# Patient Record
Sex: Female | Born: 1960 | Race: Black or African American | Marital: Married | Smoking: Never smoker
Health system: Southern US, Community
[De-identification: ages and names within clinical notes are randomized; demographics above are authoritative.]

## PROBLEM LIST (undated history)

## (undated) DIAGNOSIS — E785 Hyperlipidemia, unspecified: Secondary | ICD-10-CM

## (undated) DIAGNOSIS — I10 Essential (primary) hypertension: Secondary | ICD-10-CM

## (undated) DIAGNOSIS — K219 Gastro-esophageal reflux disease without esophagitis: Secondary | ICD-10-CM

## (undated) HISTORY — DX: Essential (primary) hypertension: I10

## (undated) HISTORY — DX: Gastro-esophageal reflux disease without esophagitis: K21.9

## (undated) HISTORY — DX: Hyperlipidemia, unspecified: E78.5

---

## 1997-08-21 ENCOUNTER — Ambulatory Visit (HOSPITAL_COMMUNITY): Admission: RE | Admit: 1997-08-21 | Discharge: 1997-08-21 | Payer: Self-pay | Admitting: Obstetrics and Gynecology

## 1997-10-15 ENCOUNTER — Inpatient Hospital Stay (HOSPITAL_COMMUNITY): Admission: AD | Admit: 1997-10-15 | Discharge: 1997-10-16 | Payer: Self-pay | Admitting: Obstetrics & Gynecology

## 1997-11-28 ENCOUNTER — Inpatient Hospital Stay (HOSPITAL_COMMUNITY): Admission: AD | Admit: 1997-11-28 | Discharge: 1997-11-28 | Payer: Self-pay | Admitting: Obstetrics and Gynecology

## 1997-11-29 ENCOUNTER — Inpatient Hospital Stay (HOSPITAL_COMMUNITY): Admission: AD | Admit: 1997-11-29 | Discharge: 1997-11-29 | Payer: Self-pay | Admitting: *Deleted

## 1997-12-17 ENCOUNTER — Inpatient Hospital Stay (HOSPITAL_COMMUNITY): Admission: AD | Admit: 1997-12-17 | Discharge: 1997-12-19 | Payer: Self-pay | Admitting: Obstetrics and Gynecology

## 1997-12-19 ENCOUNTER — Encounter (HOSPITAL_COMMUNITY): Admission: RE | Admit: 1997-12-19 | Discharge: 1998-03-19 | Payer: Self-pay | Admitting: Obstetrics and Gynecology

## 2001-03-17 ENCOUNTER — Other Ambulatory Visit: Admission: RE | Admit: 2001-03-17 | Discharge: 2001-03-17 | Payer: Self-pay | Admitting: Gynecology

## 2002-09-06 ENCOUNTER — Other Ambulatory Visit: Admission: RE | Admit: 2002-09-06 | Discharge: 2002-09-06 | Payer: Self-pay | Admitting: Gynecology

## 2004-08-24 ENCOUNTER — Ambulatory Visit: Payer: Self-pay | Admitting: Internal Medicine

## 2004-09-07 ENCOUNTER — Ambulatory Visit: Payer: Self-pay | Admitting: Internal Medicine

## 2004-09-11 ENCOUNTER — Ambulatory Visit: Payer: Self-pay | Admitting: Internal Medicine

## 2004-10-05 ENCOUNTER — Ambulatory Visit: Payer: Self-pay | Admitting: Internal Medicine

## 2005-03-23 ENCOUNTER — Other Ambulatory Visit: Admission: RE | Admit: 2005-03-23 | Discharge: 2005-03-23 | Payer: Self-pay | Admitting: Gynecology

## 2005-04-09 ENCOUNTER — Ambulatory Visit: Payer: Self-pay | Admitting: Internal Medicine

## 2005-04-15 LAB — HM COLONOSCOPY

## 2005-04-21 ENCOUNTER — Ambulatory Visit: Payer: Self-pay | Admitting: Internal Medicine

## 2005-05-04 ENCOUNTER — Ambulatory Visit: Payer: Self-pay | Admitting: Internal Medicine

## 2005-08-04 ENCOUNTER — Ambulatory Visit: Payer: Self-pay | Admitting: Internal Medicine

## 2005-10-05 ENCOUNTER — Ambulatory Visit: Payer: Self-pay | Admitting: Internal Medicine

## 2005-10-19 ENCOUNTER — Ambulatory Visit: Payer: Self-pay | Admitting: Internal Medicine

## 2005-10-29 ENCOUNTER — Ambulatory Visit: Payer: Self-pay | Admitting: Internal Medicine

## 2007-05-17 ENCOUNTER — Encounter: Payer: Self-pay | Admitting: Internal Medicine

## 2007-05-17 LAB — HM MAMMOGRAPHY: HM Mammogram: NORMAL

## 2007-05-22 ENCOUNTER — Encounter: Payer: Self-pay | Admitting: Internal Medicine

## 2007-05-22 ENCOUNTER — Encounter (INDEPENDENT_AMBULATORY_CARE_PROVIDER_SITE_OTHER): Payer: Self-pay | Admitting: *Deleted

## 2007-12-18 ENCOUNTER — Ambulatory Visit: Payer: Self-pay | Admitting: Family Medicine

## 2007-12-18 DIAGNOSIS — J069 Acute upper respiratory infection, unspecified: Secondary | ICD-10-CM | POA: Insufficient documentation

## 2007-12-18 DIAGNOSIS — I10 Essential (primary) hypertension: Secondary | ICD-10-CM | POA: Insufficient documentation

## 2008-01-01 ENCOUNTER — Ambulatory Visit: Payer: Self-pay | Admitting: Family Medicine

## 2008-01-01 DIAGNOSIS — R011 Cardiac murmur, unspecified: Secondary | ICD-10-CM

## 2008-01-02 ENCOUNTER — Encounter (INDEPENDENT_AMBULATORY_CARE_PROVIDER_SITE_OTHER): Payer: Self-pay | Admitting: *Deleted

## 2008-01-17 ENCOUNTER — Encounter: Payer: Self-pay | Admitting: Family Medicine

## 2008-01-17 ENCOUNTER — Ambulatory Visit: Payer: Self-pay

## 2008-01-21 LAB — CONVERTED CEMR LAB
Alkaline Phosphatase: 75 units/L (ref 39–117)
Bilirubin, Direct: 0.1 mg/dL (ref 0.0–0.3)
Calcium: 8.6 mg/dL (ref 8.4–10.5)
GFR calc Af Amer: 115 mL/min
Glucose, Bld: 70 mg/dL (ref 70–99)
HDL: 54.5 mg/dL (ref >39.0)
Potassium: 3.2 meq/L — ABNORMAL LOW (ref 3.5–5.1)
Sodium: 140 meq/L (ref 135–145)
Total Bilirubin: 0.9 mg/dL (ref 0.3–1.2)
Total CHOL/HDL Ratio: 4.1
Total Protein: 6.7 g/dL (ref 6.0–8.3)

## 2008-01-22 ENCOUNTER — Telehealth (INDEPENDENT_AMBULATORY_CARE_PROVIDER_SITE_OTHER): Payer: Self-pay | Admitting: *Deleted

## 2008-01-23 ENCOUNTER — Encounter (INDEPENDENT_AMBULATORY_CARE_PROVIDER_SITE_OTHER): Payer: Self-pay | Admitting: *Deleted

## 2008-06-24 ENCOUNTER — Encounter: Payer: Self-pay | Admitting: Internal Medicine

## 2008-09-16 ENCOUNTER — Ambulatory Visit: Payer: Self-pay | Admitting: Family Medicine

## 2008-09-16 DIAGNOSIS — M25519 Pain in unspecified shoulder: Secondary | ICD-10-CM

## 2008-12-23 ENCOUNTER — Ambulatory Visit: Payer: Self-pay | Admitting: Family Medicine

## 2008-12-23 DIAGNOSIS — R079 Chest pain, unspecified: Secondary | ICD-10-CM

## 2009-01-03 ENCOUNTER — Ambulatory Visit: Payer: Self-pay | Admitting: Family Medicine

## 2009-01-07 ENCOUNTER — Ambulatory Visit: Payer: Self-pay | Admitting: Family Medicine

## 2009-01-07 DIAGNOSIS — E785 Hyperlipidemia, unspecified: Secondary | ICD-10-CM

## 2009-01-07 LAB — CONVERTED CEMR LAB
Albumin: 3.6 g/dL (ref 3.5–5.2)
CO2: 29 meq/L (ref 19–32)
Calcium: 8.7 mg/dL (ref 8.4–10.5)
Chloride: 106 meq/L (ref 96–112)
Cholesterol: 216 mg/dL — ABNORMAL HIGH (ref 0–200)
Glucose, Bld: 101 mg/dL — ABNORMAL HIGH (ref 70–99)
HDL: 48.2 mg/dL (ref >39.00)
Potassium: 3.5 meq/L (ref 3.5–5.1)
Sodium: 141 meq/L (ref 135–145)
Total Bilirubin: 0.8 mg/dL (ref 0.3–1.2)
Total CHOL/HDL Ratio: 4
Triglycerides: 88 mg/dL (ref 0.0–149.0)
VLDL: 17.6 mg/dL (ref 0.0–40.0)

## 2009-12-06 ENCOUNTER — Encounter: Payer: Self-pay | Admitting: Internal Medicine

## 2010-09-01 ENCOUNTER — Encounter: Payer: Self-pay | Admitting: Family Medicine

## 2010-09-02 ENCOUNTER — Encounter: Payer: Self-pay | Admitting: Family Medicine

## 2010-09-02 ENCOUNTER — Ambulatory Visit (INDEPENDENT_AMBULATORY_CARE_PROVIDER_SITE_OTHER): Payer: BC Managed Care – PPO | Admitting: Family Medicine

## 2010-09-02 VITALS — BP 170/120 | HR 91 | Temp 98.9°F | Wt 190.6 lb

## 2010-09-02 DIAGNOSIS — I10 Essential (primary) hypertension: Secondary | ICD-10-CM

## 2010-09-02 LAB — BASIC METABOLIC PANEL
GFR: 98.86 mL/min (ref >60.00)
Glucose, Bld: 96 mg/dL (ref 70–99)
Potassium: 3.3 mEq/L — ABNORMAL LOW (ref 3.5–5.1)
Sodium: 141 mEq/L (ref 135–145)

## 2010-09-02 MED ORDER — AMLODIPINE BESYLATE 5 MG PO TABS: 5.0000 mg | ORAL_TABLET | Freq: Every day | ORAL | Status: DC

## 2010-09-02 MED ORDER — LOSARTAN POTASSIUM-HCTZ 100-25 MG PO TABS: 1.0000 | ORAL_TABLET | Freq: Every day | ORAL | Status: DC

## 2010-09-02 NOTE — Progress Notes (Signed)
  Subjective:    Patient here for follow-up of elevated blood pressure.  She is not exercising and is not adherent to a low-salt diet.  Blood pressure is not well controlled at home. Cardiac symptoms: none. Patient denies: chest pain, chest pressure/discomfort, dyspnea, exertional chest pressure/discomfort, fatigue, irregular heart beat, palpitations and tachypnea. Cardiovascular risk factors: hypertension, obesity (BMI >= 30 kg/m2) and sedentary lifestyle. Use of agents associated with hypertension: NSAIDS. History of target organ damage: none.  The following portions of the patient's history were reviewed and updated as appropriate: allergies, current medications, past family history, past medical history, past social history, past surgical history and problem list.  Review of Systems Pertinent items are noted in HPI.  + headaches   Objective:    BP 170/120  Pulse 91  Temp(Src) 98.9 F (37.2 C) (Oral)  Wt 190 lb 9.6 oz (86.456 kg)  SpO2 98% General appearance: alert, cooperative, appears stated age and no distress Eyes: conjunctivae/corneas clear. PERRL, EOM's intact. Fundi benign. Lungs: clear to auscultation bilaterally Heart: regular rate and rhythm, S1, S2 normal, no murmur, click, rub or gallop Extremities: extremities normal, atraumatic, no cyanosis or edema Neurologic: Alert and oriented X 3, normal strength and tone. Normal symmetric reflexes. Normal coordination and gait    Assessment:    Hypertension, stage 2. Evidence of target organ damage: none.    Plan:    Medication: begin norvasc and hyzaar. Screening labs for initial evaluation: basic metabolic panel. Dietary sodium restriction. Regular aerobic exercise. Follow up: 2 weeks and as needed.

## 2010-09-02 NOTE — Patient Instructions (Signed)

## 2010-09-07 ENCOUNTER — Other Ambulatory Visit (HOSPITAL_COMMUNITY): Payer: BC Managed Care – PPO | Admitting: Radiology

## 2010-09-15 ENCOUNTER — Other Ambulatory Visit: Payer: Self-pay | Admitting: *Deleted

## 2010-09-15 DIAGNOSIS — E875 Hyperkalemia: Secondary | ICD-10-CM

## 2010-09-16 ENCOUNTER — Other Ambulatory Visit (INDEPENDENT_AMBULATORY_CARE_PROVIDER_SITE_OTHER): Payer: BC Managed Care – PPO

## 2010-09-16 DIAGNOSIS — E875 Hyperkalemia: Secondary | ICD-10-CM

## 2010-09-16 LAB — BASIC METABOLIC PANEL
CO2: 29 mEq/L (ref 19–32)
Chloride: 104 mEq/L (ref 96–112)
Potassium: 3.2 mEq/L — ABNORMAL LOW (ref 3.5–5.1)
Sodium: 140 mEq/L (ref 135–145)

## 2010-09-16 NOTE — Progress Notes (Signed)
Labs only

## 2010-09-17 ENCOUNTER — Telehealth: Payer: Self-pay

## 2010-09-17 ENCOUNTER — Ambulatory Visit (HOSPITAL_COMMUNITY): Payer: BC Managed Care – PPO | Attending: Family Medicine | Admitting: Radiology

## 2010-09-17 DIAGNOSIS — I517 Cardiomegaly: Secondary | ICD-10-CM

## 2010-09-17 DIAGNOSIS — R51 Headache: Secondary | ICD-10-CM | POA: Insufficient documentation

## 2010-09-17 DIAGNOSIS — I10 Essential (primary) hypertension: Secondary | ICD-10-CM

## 2010-09-17 MED ORDER — POTASSIUM CHLORIDE ER 10 MEQ PO TBCR: 10.0000 meq | EXTENDED_RELEASE_TABLET | Freq: Every day | ORAL | Status: DC

## 2010-09-17 NOTE — Telephone Encounter (Signed)
Message copied by Arnette Norris on Thu Sep 17, 2010  4:15 PM ------      Message from: Lelon Perla      Created: Thu Sep 17, 2010 11:15 AM       If pt is already supplementing K with diet--- she should take KCL  1 po qd  #30   2 refills----  Recheck 2 weeks

## 2010-09-17 NOTE — Telephone Encounter (Signed)
Discussed with patient.... Rx faxed     KP

## 2010-09-23 NOTE — Progress Notes (Signed)
Labs only

## 2010-09-24 ENCOUNTER — Ambulatory Visit (INDEPENDENT_AMBULATORY_CARE_PROVIDER_SITE_OTHER): Payer: BC Managed Care – PPO | Admitting: Family Medicine

## 2010-09-24 VITALS — BP 138/100 | HR 93 | Temp 98.0°F | Wt 191.0 lb

## 2010-09-24 DIAGNOSIS — R319 Hematuria, unspecified: Secondary | ICD-10-CM

## 2010-09-24 DIAGNOSIS — I10 Essential (primary) hypertension: Secondary | ICD-10-CM

## 2010-09-24 DIAGNOSIS — E876 Hypokalemia: Secondary | ICD-10-CM

## 2010-09-24 LAB — POCT URINALYSIS DIPSTICK
Glucose, UA: NEGATIVE
Nitrite, UA: NEGATIVE
Protein, UA: NEGATIVE
Urobilinogen, UA: 0.2

## 2010-09-24 LAB — BASIC METABOLIC PANEL
BUN: 16 mg/dL (ref 6–23)
Chloride: 102 mEq/L (ref 96–112)
Glucose, Bld: 94 mg/dL (ref 70–99)
Potassium: 3.9 mEq/L (ref 3.5–5.1)
Sodium: 138 mEq/L (ref 135–145)

## 2010-09-24 LAB — CBC WITH DIFFERENTIAL/PLATELET
Basophils Absolute: 0 10*3/uL (ref 0.0–0.1)
Eosinophils Absolute: 0.1 10*3/uL (ref 0.0–0.7)
Lymphocytes Relative: 29.1 % (ref 12.0–46.0)
MCHC: 33.5 g/dL (ref 30.0–36.0)
Neutrophils Relative %: 64.3 % (ref 43.0–77.0)
Platelets: 271 10*3/uL (ref 150.0–400.0)
RDW: 12.8 % (ref 11.5–14.6)

## 2010-09-24 LAB — HEPATIC FUNCTION PANEL
ALT: 21 U/L (ref 0–35)
AST: 22 U/L (ref 0–37)
Albumin: 3.9 g/dL (ref 3.5–5.2)
Alkaline Phosphatase: 65 U/L (ref 39–117)

## 2010-09-24 LAB — LIPID PANEL
Cholesterol: 215 mg/dL — ABNORMAL HIGH (ref 0–200)
Total CHOL/HDL Ratio: 4

## 2010-09-24 MED ORDER — AMLODIPINE BESYLATE 10 MG PO TABS: 10.0000 mg | ORAL_TABLET | Freq: Every day | ORAL | Status: DC

## 2010-09-24 NOTE — Progress Notes (Signed)
  Subjective:    Patient here for follow-up of elevated blood pressure.  She is not exercising and is adherent to a low-salt diet.  Blood pressure is not well controlled at home. Cardiac symptoms: none. Patient denies: chest pain, chest pressure/discomfort, claudication, dyspnea, exertional chest pressure/discomfort, fatigue, irregular heart beat, lower extremity edema, near-syncope, orthopnea, palpitations, paroxysmal nocturnal dyspnea, syncope and tachypnea. Cardiovascular risk factors: hypertension, obesity (BMI >= 30 kg/m2) and sedentary lifestyle. Use of agents associated with hypertension: none. History of target organ damage: none.  The following portions of the patient's history were reviewed and updated as appropriate: allergies, current medications, past family history, past medical history, past social history, past surgical history and problem list.  Review of Systems Pertinent items are noted in HPI.     Objective:    BP 138/100  Pulse 93  Temp(Src) 98 F (36.7 C) (Oral)  Wt 191 lb (86.637 kg)  SpO2 93% General appearance: alert, cooperative and appears stated age Lungs: clear to auscultation bilaterally Heart: S1, S2 normal Extremities: extremities normal, atraumatic, no cyanosis or edema    Assessment:    Hypertension, stage 1 . Evidence of target organ damage: none.    Plan:    Medication: increase to norvasc 10mg   and con't hyzaar. Dietary sodium restriction. Regular aerobic exercise. Check blood pressures 2-3 times weekly and record. Follow up: 2 weeks and as needed.  Check fasting labs

## 2010-09-24 NOTE — Patient Instructions (Signed)
Hypertension Information  As your heart beats, it forces blood through your arteries. This force is your blood pressure. If the pressure is too high, it is called hypertension (HTN) or high blood pressure. HTN is dangerous because you may have it and not know it. High blood pressure may mean that your heart has to work harder to pump blood. Your arteries may be narrow or stiff. The extra work puts you at risk for heart disease, stroke, and other problems.    Blood pressure consists of two numbers, a higher number over a lower, 110/72, for example. It is stated as "110 over 72." The ideal is below 120 for the top number (systolic) and under 80 for the bottom (diastolic).    You should pay close attention to your blood pressure if you have certain conditions such as:   Heart failure.    Prior heart attack.      Diabetes    Chronic kidney disease.      Prior stroke.      Multiple risk factors for heart disease.      To see if you have HTN, your blood pressure should be measured while you are seated with your arm held at the level of the heart. It should be measured at least twice. A one-time elevated blood pressure reading (especially in the Emergency Department) does not mean that you need treatment. There may be conditions in which the blood pressure is different between your right and left arms. It is important to see your caregiver soon for a recheck.  Most people have essential hypertension which means that there is not a specific cause. This type of high blood pressure may be lowered by changing lifestyle factors such as:   Stress.    Smoking.     Lack of exercise.     Excessive weight.    Drug/tobacco/alcohol use.      Eating less salt.      Most people do not have symptoms from high blood pressure until it has caused damage to the body. Effective treatment can often prevent, delay or reduce that damage.  TREATMENT   Treatment for high blood pressure, when a cause has been identified, is directed at the cause. There are a large number of medications to treat HTN. These fall into several categories, and your caregiver will help you select the medicines that are best for you. Medications may have side effects. You should review side effects with your caregiver.  If your blood pressure stays high after you have made lifestyle changes or started on medicines,     Your medication(s) may need to be changed.    Other problems may need to be addressed.    Be certain you understand your prescriptions, and know how and when to take your medicine.    Be sure to follow up with your caregiver within the time frame advised (usually within two weeks) to have your blood pressure rechecked and to review your medications.    If you are taking more than one medicine to lower your blood pressure, make sure you know how and at what times they should be taken. Taking two medicines at the same time can result in blood pressure that is too low.   Document Released: 04/06/2005 Document Re-Released: 04/28/2009  [employer] Patient Information 2011 [employer].

## 2010-09-26 LAB — URINE CULTURE
Colony Count: NO GROWTH
Organism ID, Bacteria: NO GROWTH

## 2010-09-28 ENCOUNTER — Encounter: Payer: Self-pay | Admitting: *Deleted

## 2010-09-28 NOTE — Progress Notes (Signed)
Pt aware of results 

## 2010-10-15 ENCOUNTER — Ambulatory Visit (INDEPENDENT_AMBULATORY_CARE_PROVIDER_SITE_OTHER): Payer: BC Managed Care – PPO | Admitting: Family Medicine

## 2010-10-15 ENCOUNTER — Telehealth: Payer: Self-pay | Admitting: Family Medicine

## 2010-10-15 ENCOUNTER — Encounter: Payer: Self-pay | Admitting: Family Medicine

## 2010-10-15 VITALS — BP 124/80 | Temp 98.6°F | Wt 194.6 lb

## 2010-10-15 DIAGNOSIS — IMO0002 Reserved for concepts with insufficient information to code with codable children: Secondary | ICD-10-CM

## 2010-10-15 DIAGNOSIS — M5410 Radiculopathy, site unspecified: Secondary | ICD-10-CM

## 2010-10-15 MED ORDER — CYCLOBENZAPRINE HCL 10 MG PO TABS: 10.0000 mg | ORAL_TABLET | Freq: Three times a day (TID) | ORAL | Status: AC | PRN

## 2010-10-15 MED ORDER — NAPROXEN 500 MG PO TABS: 500.0000 mg | ORAL_TABLET | Freq: Two times a day (BID) | ORAL | Status: AC

## 2010-10-15 NOTE — Progress Notes (Signed)
  Subjective:    Patient ID: Diana Randall, female    DOB: 1960-12-25, 50 y.o.   MRN: [id number]  HPI Leg pain- R leg, sxs started a few nights ago but worse last night.  Leg was swollen up to level of knee per husband's report.  Walked 1.5 miles last night.  Leg feels 'heavy and thick'.  Some low back discomfort.  Sits all day at work.  Has hx of sciatica since childbirth.  No redness, current swelling, period of immobility or recent travel.   Review of Systems For ROS see HPI     Objective:   Physical Exam  Vitals reviewed. Constitutional: She appears well-developed and well-nourished. No distress.  Musculoskeletal: Normal range of motion. She exhibits tenderness (w/ palpation of paraspinal muscles, R>L, and palpation of R glute). She exhibits no edema.       Normal flexion and extension of back (-) SLR bilaterally (-) Homan's No swelling today  Neurological: She has normal reflexes. Coordination normal.          Assessment & Plan:

## 2010-10-15 NOTE — Patient Instructions (Signed)
This all appears to be related to your back spasm Take the Naprosyn regularly for the next 5-7 days and then as needed for pain Use the Flexeril (muscle relaxer) at night- will make you sleepy Try and have someone give you a massage to relax those tight muscles Use a heating pad on the low back and glutes to relax those muscles If your symptoms change or worsen- please call [facility]!!!

## 2010-10-15 NOTE — Telephone Encounter (Signed)
Discussed with patient and has been scheduled with Dr.Tabori at 1:45    KP

## 2010-10-15 NOTE — Assessment & Plan Note (Signed)
Pt w/out swelling or pain today.  Swelling last night was likely due to walking.  Heaviness is likely due to nerve irritation caused by paraspinal and glute spasm.  No evidence or suspicion of DVT.  Start scheduled NSAIDs and muscle relaxers.  Reviewed supportive care and red flags that should prompt return.  Pt expressed understanding and is in agreement w/ plan.

## 2010-10-15 NOTE — Telephone Encounter (Signed)
Pt needs to be seen ----she needs to be evaluated for a clot

## 2010-10-21 ENCOUNTER — Encounter: Payer: Self-pay | Admitting: Family Medicine

## 2010-10-21 ENCOUNTER — Ambulatory Visit (INDEPENDENT_AMBULATORY_CARE_PROVIDER_SITE_OTHER): Payer: BC Managed Care – PPO | Admitting: Family Medicine

## 2010-10-21 VITALS — BP 124/92 | HR 101 | Temp 99.1°F | Wt 195.0 lb

## 2010-10-21 DIAGNOSIS — I10 Essential (primary) hypertension: Secondary | ICD-10-CM

## 2010-10-21 MED ORDER — VALSARTAN-HYDROCHLOROTHIAZIDE 160-12.5 MG PO TABS: 1.0000 | ORAL_TABLET | Freq: Every day | ORAL | Status: DC

## 2010-10-21 NOTE — Progress Notes (Signed)
  Subjective:    Patient here for follow-up of elevated blood pressure.  She is not exercising and is adherent to a low-salt diet.  Blood pressure is not well controlled at home. Cardiac symptoms: none. Patient denies: chest pain, chest pressure/discomfort, claudication, dyspnea, exertional chest pressure/discomfort, fatigue, irregular heart beat, lower extremity edema, near-syncope, orthopnea, palpitations, paroxysmal nocturnal dyspnea, syncope and tachypnea. Cardiovascular risk factors: hypertension, obesity (BMI >= 30 kg/m2) and sedentary lifestyle. Use of agents associated with hypertension: none. History of target organ damage: none.  The following portions of the patient's history were reviewed and updated as appropriate: allergies, current medications, past family history, past medical history, past social history, past surgical history and problem list.  Review of Systems Pertinent items are noted in HPI.     Objective:    BP 124/92  Pulse 101  Temp(Src) 99.1 F (37.3 C) (Oral)  Wt 195 lb (88.451 kg)  SpO2 97% General appearance: alert, cooperative, appears stated age and no distress Lungs: clear to auscultation bilaterally Heart: S1, S2 normal Extremities: extremities normal, atraumatic, no cyanosis or edema    Assessment:    Hypertension, stage 1 . Evidence of target organ damage: none.    Plan:    Medication: discontinue hyzaar and begin diovan hct. Dietary sodium restriction. Regular aerobic exercise. Follow up: 2 weeks and as needed.

## 2010-10-21 NOTE — Patient Instructions (Signed)

## 2010-11-06 ENCOUNTER — Ambulatory Visit: Payer: BC Managed Care – PPO | Admitting: Family Medicine

## 2010-11-10 ENCOUNTER — Ambulatory Visit (INDEPENDENT_AMBULATORY_CARE_PROVIDER_SITE_OTHER): Payer: BC Managed Care – PPO | Admitting: Family Medicine

## 2010-11-10 ENCOUNTER — Encounter: Payer: Self-pay | Admitting: Family Medicine

## 2010-11-10 VITALS — BP 132/80 | HR 85 | Temp 98.9°F | Wt 191.4 lb

## 2010-11-10 DIAGNOSIS — I10 Essential (primary) hypertension: Secondary | ICD-10-CM

## 2010-11-10 DIAGNOSIS — E785 Hyperlipidemia, unspecified: Secondary | ICD-10-CM

## 2010-11-10 MED ORDER — VALSARTAN-HYDROCHLOROTHIAZIDE 160-12.5 MG PO TABS: 1.0000 | ORAL_TABLET | Freq: Every day | ORAL | Status: DC

## 2010-11-10 NOTE — Patient Instructions (Signed)

## 2010-11-10 NOTE — Progress Notes (Signed)
  Subjective:    Patient here for follow-up of elevated blood pressure.  She is not exercising and is adherent to a low-salt diet.  Blood pressure is well controlled at home. Cardiac symptoms: none. Patient denies: chest pain, chest pressure/discomfort, claudication, dyspnea, exertional chest pressure/discomfort, fatigue, irregular heart beat, lower extremity edema, near-syncope, orthopnea, palpitations, paroxysmal nocturnal dyspnea, syncope and tachypnea. Cardiovascular risk factors: dyslipidemia, hypertension, obesity (BMI >= 30 kg/m2) and sedentary lifestyle. Use of agents associated with hypertension: none. History of target organ damage: none.  The following portions of the patient's history were reviewed and updated as appropriate: allergies, current medications, past family history, past medical history, past social history, past surgical history and problem list.  Review of Systems Pertinent items are noted in HPI.     Objective:   Filed Vitals:   11/10/10 1403 11/10/10 1425  BP: 134/92 132/80  Pulse: 85   Temp: 98.9 F (37.2 C)   TempSrc: Oral   Weight: 191 lb 6.4 oz (86.818 kg)   SpO2: 98%     /gen-AAOx3  nAd Cor -+S1S2  -m Lungs--ctab/l  - rrw Ext--  -cce    Assessment:    Hypertension,  Evidence of target organ damage: none .    Plan:    con't meds Check labs  rto 3 months or sooner prn

## 2011-03-02 ENCOUNTER — Encounter: Payer: Self-pay | Admitting: Family Medicine

## 2011-05-06 ENCOUNTER — Other Ambulatory Visit: Payer: Self-pay | Admitting: *Deleted

## 2011-05-06 DIAGNOSIS — I10 Essential (primary) hypertension: Secondary | ICD-10-CM

## 2011-05-06 MED ORDER — AMLODIPINE BESYLATE 10 MG PO TABS: 10.0000 mg | ORAL_TABLET | Freq: Every day | ORAL | Status: DC

## 2011-05-06 NOTE — Telephone Encounter (Signed)
Pt called to advise that she is going out of town and needs a refill for amlodipine 10mg  sent to [facility] on [address]. Noted pt has been seen office since 11-10-10, notes advise for pt to be seen again in 3 months for a follow up, called pt to advise that I can send #30 however she needs to set up an upcoming apt for a follow up visit, offered for pt to set up with me today, pt declined and advised that she will call back in to schedule an follow up OV, reiterated that no more medications can be filled until she is seen in office, pt understood, sent amlodipine 10mg  #30 no refills to [facility] [address] per pt request

## 2012-11-06 ENCOUNTER — Ambulatory Visit (INDEPENDENT_AMBULATORY_CARE_PROVIDER_SITE_OTHER): Payer: BC Managed Care – PPO | Admitting: Family Medicine

## 2012-11-06 ENCOUNTER — Encounter: Payer: Self-pay | Admitting: Family Medicine

## 2012-11-06 VITALS — BP 165/105 | HR 88 | Temp 98.5°F | Wt 204.8 lb

## 2012-11-06 DIAGNOSIS — R609 Edema, unspecified: Secondary | ICD-10-CM

## 2012-11-06 DIAGNOSIS — R319 Hematuria, unspecified: Secondary | ICD-10-CM

## 2012-11-06 DIAGNOSIS — I10 Essential (primary) hypertension: Secondary | ICD-10-CM

## 2012-11-06 LAB — BASIC METABOLIC PANEL
BUN: 12 mg/dL (ref 6–23)
Calcium: 8.3 mg/dL — ABNORMAL LOW (ref 8.4–10.5)
GFR: 100.96 mL/min (ref >60.00)
Glucose, Bld: 73 mg/dL (ref 70–99)

## 2012-11-06 LAB — POCT URINALYSIS DIPSTICK
Glucose, UA: NEGATIVE
Nitrite, UA: NEGATIVE
Protein, UA: NEGATIVE
Spec Grav, UA: 1.015
Urobilinogen, UA: 0.2
pH, UA: 6.5

## 2012-11-06 LAB — HEPATIC FUNCTION PANEL
AST: 20 U/L (ref 0–37)
Total Bilirubin: 0.8 mg/dL (ref 0.3–1.2)

## 2012-11-06 LAB — CBC WITH DIFFERENTIAL/PLATELET
Basophils Absolute: 0 10*3/uL (ref 0.0–0.1)
Eosinophils Absolute: 0.1 10*3/uL (ref 0.0–0.7)
HCT: 39.8 % (ref 36.0–46.0)
Lymphocytes Relative: 35.7 % (ref 12.0–46.0)
Lymphs Abs: 2.9 10*3/uL (ref 0.7–4.0)
MCHC: 33.9 g/dL (ref 30.0–36.0)
Monocytes Relative: 3.7 % (ref 3.0–12.0)
Platelets: 252 10*3/uL (ref 150.0–400.0)
RDW: 13.5 % (ref 11.5–14.6)

## 2012-11-06 LAB — LIPID PANEL
Cholesterol: 221 mg/dL — ABNORMAL HIGH (ref 0–200)
HDL: 45.4 mg/dL (ref >39.00)
Total CHOL/HDL Ratio: 5
Triglycerides: 185 mg/dL — ABNORMAL HIGH (ref 0.0–149.0)
VLDL: 37 mg/dL (ref 0.0–40.0)

## 2012-11-06 MED ORDER — AMLODIPINE-VALSARTAN-HCTZ 10-160-12.5 MG PO TABS: ORAL_TABLET | ORAL | Status: DC

## 2012-11-06 NOTE — Progress Notes (Signed)
  Subjective:    Patient here for follow-up of elevated blood pressure.  She is not exercising and is adherent to a low-salt diet.  Blood pressure is not well controlled at home. Cardiac symptoms: none. Patient denies: chest pain, chest pressure/discomfort, claudication, dyspnea, exertional chest pressure/discomfort, fatigue, irregular heart beat, lower extremity edema, near-syncope, orthopnea, palpitations, paroxysmal nocturnal dyspnea, syncope and tachypnea. Cardiovascular risk factors: hypertension, obesity (BMI >= 30 kg/m2) and sedentary lifestyle. Use of agents associated with hypertension: none. History of target organ damage: none.  The following portions of the patient's history were reviewed and updated as appropriate: allergies, current medications, past family history, past medical history, past social history, past surgical history and problem list.  Review of Systems Pertinent items are noted in HPI.     Objective:    BP 165/105  Pulse 88  Temp(Src) 98.5 F (36.9 C) (Oral)  Wt 204 lb 12.8 oz (92.897 kg)  BMI 34.08 kg/m2  SpO2 97% General appearance: alert, cooperative, appears stated age and no distress Neck: no adenopathy, supple, symmetrical, trachea midline and thyroid not enlarged, symmetric, no tenderness/mass/nodules Lungs: clear to auscultation bilaterally Heart: S1, S2 normal Extremities: edema tr pitting edema b/l low ext    Assessment:    Hypertension, stage 1 . Evidence of target organ damage: none.    Plan:    Medication: resume exforge hct. Dietary sodium restriction. Regular aerobic exercise. Follow up: 2 weeks and as needed.

## 2012-11-06 NOTE — Patient Instructions (Signed)

## 2012-11-07 LAB — LDL CHOLESTEROL, DIRECT: Direct LDL: 151.9 mg/dL

## 2012-11-08 LAB — URINE CULTURE: Organism ID, Bacteria: NO GROWTH

## 2012-11-09 ENCOUNTER — Other Ambulatory Visit: Payer: Self-pay

## 2012-11-09 MED ORDER — POTASSIUM CHLORIDE ER 10 MEQ PO TBCR: 20.0000 meq | EXTENDED_RELEASE_TABLET | Freq: Every day | ORAL | Status: DC

## 2012-11-09 MED ORDER — ATORVASTATIN CALCIUM 20 MG PO TABS: 20.0000 mg | ORAL_TABLET | Freq: Every day | ORAL | Status: DC

## 2012-11-23 ENCOUNTER — Ambulatory Visit (INDEPENDENT_AMBULATORY_CARE_PROVIDER_SITE_OTHER): Payer: BC Managed Care – PPO | Admitting: Family Medicine

## 2012-11-23 ENCOUNTER — Encounter: Payer: Self-pay | Admitting: Family Medicine

## 2012-11-23 VITALS — BP 134/90 | HR 105 | Temp 98.7°F | Wt 201.0 lb

## 2012-11-23 DIAGNOSIS — I10 Essential (primary) hypertension: Secondary | ICD-10-CM

## 2012-11-23 DIAGNOSIS — E669 Obesity, unspecified: Secondary | ICD-10-CM | POA: Insufficient documentation

## 2012-11-23 MED ORDER — AMLODIPINE-VALSARTAN-HCTZ 10-320-25 MG PO TABS: ORAL_TABLET | ORAL | Status: DC

## 2012-11-23 NOTE — Progress Notes (Signed)
  Subjective:    Patient here for follow-up of elevated blood pressure.  She is exercising and is adherent to a low-salt diet.  Blood pressure is not well controlled at home. Cardiac symptoms: none. Patient denies: chest pain, chest pressure/discomfort, claudication, dyspnea, exertional chest pressure/discomfort, fatigue, irregular heart beat, lower extremity edema, near-syncope, orthopnea, palpitations, paroxysmal nocturnal dyspnea, syncope and tachypnea. Cardiovascular risk factors: dyslipidemia, family history of premature cardiovascular disease, hypertension and obesity (BMI >= 30 kg/m2). Use of agents associated with hypertension: none. History of target organ damage: none.  The following portions of the patient's history were reviewed and updated as appropriate: allergies, current medications, past family history, past medical history, past social history, past surgical history and problem list.  Review of Systems Pertinent items are noted in HPI.     Objective:    General appearance: alert, cooperative, appears stated age and no distress Neck: no adenopathy, supple, symmetrical, trachea midline and thyroid not enlarged, symmetric, no tenderness/mass/nodules Lungs: clear to auscultation bilaterally Heart: S1, S2 normal Extremities: extremities normal, atraumatic, no cyanosis or edema    Assessment:    Hypertension, stage 1 . Evidence of target organ damage: none.    Plan:    Medication: increase to exforge 10/320/25. Dietary sodium restriction. Regular aerobic exercise. Check blood pressures 2-3 weeks times weekly and record. Follow up: 3 weeks and as needed.

## 2012-11-23 NOTE — Patient Instructions (Signed)

## 2012-12-04 ENCOUNTER — Encounter: Payer: Self-pay | Admitting: Family Medicine

## 2012-12-14 ENCOUNTER — Encounter: Payer: Self-pay | Admitting: Family Medicine

## 2012-12-14 ENCOUNTER — Ambulatory Visit (INDEPENDENT_AMBULATORY_CARE_PROVIDER_SITE_OTHER): Payer: BC Managed Care – PPO | Admitting: Family Medicine

## 2012-12-14 VITALS — BP 132/84 | HR 94 | Temp 98.5°F | Wt 202.6 lb

## 2012-12-14 DIAGNOSIS — R079 Chest pain, unspecified: Secondary | ICD-10-CM

## 2012-12-14 DIAGNOSIS — I10 Essential (primary) hypertension: Secondary | ICD-10-CM

## 2012-12-14 DIAGNOSIS — E876 Hypokalemia: Secondary | ICD-10-CM

## 2012-12-14 MED ORDER — POTASSIUM CHLORIDE ER 10 MEQ PO TBCR: EXTENDED_RELEASE_TABLET | ORAL | Status: DC

## 2012-12-14 NOTE — Patient Instructions (Signed)

## 2012-12-14 NOTE — Progress Notes (Signed)
  Subjective:    Patient here for follow-up of elevated blood pressure.  She is not exercising and is adherent to a low-salt diet.  Blood pressure is well controlled at home. Cardiac symptoms: chest pressure/discomfort. Patient denies: claudication, dyspnea, exertional chest pressure/discomfort, fatigue, irregular heart beat, lower extremity edema, near-syncope, orthopnea, palpitations, paroxysmal nocturnal dyspnea, syncope and tachypnea. Cardiovascular risk factors: dyslipidemia, hypertension, obesity (BMI >= 30 kg/m2) and sedentary lifestyle. Use of agents associated with hypertension: none. History of target organ damage: none.  The following portions of the patient's history were reviewed and updated as appropriate: allergies, current medications, past family history, past medical history, past social history, past surgical history and problem list.  Review of Systems Pertinent items are noted in HPI.     Objective:    BP 132/84  Pulse 94  Temp(Src) 98.5 F (36.9 C) (Oral)  Wt 202 lb 9.6 oz (91.899 kg)  BMI 33.71 kg/m2  SpO2 97% General appearance: alert, cooperative, appears stated age and no distress Nose: Nares normal. Septum midline. Mucosa normal. No drainage or sinus tenderness. Throat: lips, mucosa, and tongue normal; teeth and gums normal Neck: no adenopathy, no carotid bruit, no JVD, supple, symmetrical, trachea midline and thyroid not enlarged, symmetric, no tenderness/mass/nodules Lungs: clear to auscultation bilaterally Heart: S1, S2 normal Extremities: extremities normal, atraumatic, no cyanosis or edema    Assessment:    Hypertension, normal blood pressure . Evidence of target organ damage: none.    Plan:    Medication: no change. Dietary sodium restriction. Regular aerobic exercise. Check blood pressures 2-3 times weekly and record. Follow up: 3 months and as needed.

## 2012-12-21 ENCOUNTER — Other Ambulatory Visit: Payer: Self-pay

## 2012-12-29 ENCOUNTER — Encounter: Payer: Self-pay | Admitting: Family Medicine

## 2012-12-29 DIAGNOSIS — I10 Essential (primary) hypertension: Secondary | ICD-10-CM

## 2012-12-29 MED ORDER — AMLODIPINE-VALSARTAN-HCTZ 10-320-25 MG PO TABS: ORAL_TABLET | ORAL | Status: DC

## 2012-12-29 NOTE — Telephone Encounter (Signed)
Please advise      KP 

## 2013-03-09 ENCOUNTER — Ambulatory Visit (INDEPENDENT_AMBULATORY_CARE_PROVIDER_SITE_OTHER): Payer: BC Managed Care – PPO | Admitting: Internal Medicine

## 2013-03-09 ENCOUNTER — Encounter: Payer: Self-pay | Admitting: Internal Medicine

## 2013-03-09 ENCOUNTER — Ambulatory Visit: Payer: BC Managed Care – PPO | Admitting: Physician Assistant

## 2013-03-09 VITALS — BP 122/90 | HR 95 | Temp 99.1°F | Ht 66.0 in | Wt 199.2 lb

## 2013-03-09 DIAGNOSIS — J209 Acute bronchitis, unspecified: Secondary | ICD-10-CM

## 2013-03-09 DIAGNOSIS — I10 Essential (primary) hypertension: Secondary | ICD-10-CM

## 2013-03-09 DIAGNOSIS — R079 Chest pain, unspecified: Secondary | ICD-10-CM

## 2013-03-09 MED ORDER — HYDROCODONE-HOMATROPINE 5-1.5 MG/5ML PO SYRP: 5.0000 mL | ORAL_SOLUTION | Freq: Four times a day (QID) | ORAL | Status: DC | PRN

## 2013-03-09 MED ORDER — LEVOFLOXACIN 250 MG PO TABS: 250.0000 mg | ORAL_TABLET | Freq: Every day | ORAL | Status: DC

## 2013-03-09 NOTE — Assessment & Plan Note (Signed)
Exam benign, related to coughing, ok to follow for now

## 2013-03-09 NOTE — Assessment & Plan Note (Signed)
stable overall by history and exam, recent data reviewed with pt, and pt to continue medical treatment as before,  to f/u any worsening symptoms or concerns BP Readings from Last 3 Encounters:  03/09/13 122/90  12/14/12 132/84  11/23/12 134/90

## 2013-03-09 NOTE — Assessment & Plan Note (Signed)
Mild to mod, for antibx course,  to f/u any worsening symptoms or concerns, and cough med prn 

## 2013-03-09 NOTE — Progress Notes (Signed)
   Subjective:    Patient ID: Diana Randall, female    DOB: 08/16/1960, 53 y.o.   MRN: [id number]  HPI  Here with acute onset mild to mod 2-3 days ST, HA, general weakness and malaise, with prod cough greenish sputum, but Pt denies chest pain, increased sob or doe, wheezing, orthopnea, PND, increased LE swelling, palpitations, dizziness or syncope, all except for some bilat ant chest soreness with cough.  Pt denies new neurological symptoms such as new headache, or facial or extremity weakness or numbness  Pt denies polydipsia, polyuria Past Medical History  Diagnosis Date  . Hypertension   . Hyperlipidemia    No past surgical history on file.  reports that she has never smoked. She has never used smokeless tobacco. She reports that she drinks alcohol. She reports that she does not use illicit drugs. family history is not on file. No Known Allergies Current Outpatient Prescriptions on File Prior to Visit  Medication Sig Dispense Refill  . Amlodipine-Valsartan-HCTZ (EXFORGE HCT) 10-320-25 MG TABS 1 po qd  30 tablet  2  . atorvastatin (LIPITOR) 20 MG tablet Take 1 tablet (20 mg total) by mouth daily.  30 tablet  2  . potassium chloride (K-DUR) 10 MEQ tablet 1 po qd  30 tablet  5   No current facility-administered medications on file prior to visit.   Review of Systems  Constitutional: Negative for unexpected weight change, or unusual diaphoresis  HENT: Negative for tinnitus.   Eyes: Negative for photophobia and visual disturbance.  Respiratory: Negative for choking and stridor.   Gastrointestinal: Negative for vomiting and blood in stool.  Genitourinary: Negative for hematuria and decreased urine volume.  Musculoskeletal: Negative for acute joint swelling Skin: Negative for color change and wound.  Neurological: Negative for tremors and numbness other than noted  Psychiatric/Behavioral: Negative for decreased concentration or  hyperactivity.       Objective:   Physical Exam BP  122/90  Pulse 95  Temp(Src) 99.1 F (37.3 C) (Oral)  Ht 5\' 6"  (1.676 m)  Wt 199 lb 4 oz (90.379 kg)  BMI 32.18 kg/m2  SpO2 97% VS noted, mild ill  Constitutional: Pt appears well-developed and well-nourished.  HENT: Head: NCAT.  Right Ear: External ear normal.  Left Ear: External ear normal.  Bilat tm's with mild erythema.  Max sinus areas non tender.  Pharynx with mild erythema, no exudate Eyes: Conjunctivae and EOM are normal. Pupils are equal, round, and reactive to light.  Neck: Normal range of motion. Neck supple.  Cardiovascular: Normal rate and regular rhythm.   Pulmonary/Chest: Effort normal and breath sounds without rales or wheezing.  Neurological: Pt is alert. Not confused  Skin: Skin is warm. No erythema.  Psychiatric: Pt behavior is normal. Thought content normal.     Assessment & Plan:

## 2013-03-09 NOTE — Patient Instructions (Addendum)
Please take all new medication as prescribed - the antibiotic, and cough medicine  Please continue all other medications as before, and refills have been done if requested. Please have the pharmacy call with any other refills you may need.  Please remember to sign up for MyChart if you have not done so, as this will be important to you in the future with finding out test results, communicating by private email, and scheduling acute appointments online when needed.   

## 2013-03-09 NOTE — Progress Notes (Signed)
Pre-visit discussion using our clinic review tool. No additional management support is needed unless otherwise documented below in the visit note.  

## 2013-04-02 ENCOUNTER — Encounter: Payer: Self-pay | Admitting: Family Medicine

## 2013-04-05 ENCOUNTER — Telehealth: Payer: Self-pay | Admitting: *Deleted

## 2013-04-05 ENCOUNTER — Encounter: Payer: Self-pay | Admitting: Nurse Practitioner

## 2013-04-05 ENCOUNTER — Ambulatory Visit (INDEPENDENT_AMBULATORY_CARE_PROVIDER_SITE_OTHER): Payer: BC Managed Care – PPO | Admitting: Nurse Practitioner

## 2013-04-05 VITALS — BP 121/79 | HR 81 | Temp 97.9°F | Wt 201.6 lb

## 2013-04-05 DIAGNOSIS — J069 Acute upper respiratory infection, unspecified: Secondary | ICD-10-CM

## 2013-04-05 DIAGNOSIS — R059 Cough, unspecified: Secondary | ICD-10-CM

## 2013-04-05 DIAGNOSIS — R05 Cough: Secondary | ICD-10-CM

## 2013-04-05 MED ORDER — BENZONATATE 100 MG PO CAPS: ORAL_CAPSULE | ORAL | Status: DC

## 2013-04-05 NOTE — Telephone Encounter (Signed)
Rx unavailable at [facility]. [facility] [address]

## 2013-04-05 NOTE — Progress Notes (Signed)
   Subjective:    Patient ID: Diana Randall, female    DOB: 05/02/1960, 53 y.o.   MRN: [id number]  HPI Comments: Gives Hx of URI 1 mo. Ago-treated w/levaquin. Got better. Then developed "stomach virus" w/N&V (states rest of family had it). Got better. The started coughing 2da.  URI  This is a new problem. The current episode started in the past 7 days (2d). The problem has been unchanged. There has been no fever. Associated symptoms include congestion, coughing, headaches, a plugged ear sensation, sinus pain, a sore throat (scratchy) and wheezing (hears "gurgle" when lays down.). Pertinent negatives include no abdominal pain, chest pain, diarrhea, ear pain, nausea, sneezing, swollen glands or vomiting. She has tried nothing for the symptoms.      Review of Systems  Constitutional: Negative for fever, chills, activity change, appetite change and fatigue.  HENT: Positive for congestion, sinus pressure and sore throat (scratchy). Negative for ear pain, sneezing and voice change.   Eyes: Negative for redness.  Respiratory: Positive for cough and wheezing (hears "gurgle" when lays down.). Negative for chest tightness and shortness of breath.   Cardiovascular: Negative for chest pain.  Gastrointestinal: Negative for nausea, vomiting, abdominal pain and diarrhea.  Musculoskeletal: Negative for back pain.  Neurological: Positive for headaches.       Objective:   Physical Exam  Vitals reviewed. Constitutional: She is oriented to person, place, and time. She appears well-developed and well-nourished. No distress.  HENT:  Head: Normocephalic and atraumatic.  Right Ear: External ear normal.  Left Ear: External ear normal.  Mouth/Throat: Oropharynx is clear and moist. No oropharyngeal exudate.  Nasal quality to voice  Eyes: Conjunctivae are normal. Right eye exhibits no discharge. Left eye exhibits no discharge.  Neck: Normal range of motion. Neck supple. No thyromegaly present.    Cardiovascular: Normal rate, regular rhythm and normal heart sounds.   No murmur heard. Pulmonary/Chest: Effort normal and breath sounds normal. No respiratory distress. She has no wheezes. She has no rales.  Lymphadenopathy:    She has no cervical adenopathy.  Neurological: She is alert and oriented to person, place, and time.  Skin: Skin is warm and dry.  Psychiatric: She has a normal mood and affect. Her behavior is normal. Thought content normal.          Assessment & Plan:  1. Viral upper respiratory illness 2d. Nasal congestion, cough, afebrile. See pt instructions.  2. Cough Likely r/t post-nasal drip. Clear BS. - benzonatate (TESSALON) 100 MG capsule; Take 1-2 capsules po up to 3 times daily PRN cough  Dispense: 60 capsule; Refill: 0

## 2013-04-05 NOTE — Patient Instructions (Signed)
You have a cold virus causing your symptoms. The average duration of cold symptoms is 14 days. Start daily sinus rinses (neilmed Sinus Rinse). Use 30 mg pseudoephedrine twice daily. Caution: monitor blood pressure 1 hour after taking med, stop if pressure over 140/90. Sip fluids every hour. Rest. If you are not feeling better in 1 week or develop fever or chest pain, call us for re-evaluation. Feel better!  Upper Respiratory Infection, Adult An upper respiratory infection (URI) is also sometimes known as the common cold. The upper respiratory tract includes the nose, sinuses, throat, trachea, and bronchi. Bronchi are the airways leading to the lungs. Most people improve within 1 week, but symptoms can last up to 2 weeks. A residual cough may last even longer.  CAUSES Many different viruses can infect the tissues lining the upper respiratory tract. The tissues become irritated and inflamed and often become very moist. Mucus production is also common. A cold is contagious. You can easily spread the virus to others by oral contact. This includes kissing, sharing a glass, coughing, or sneezing. Touching your mouth or nose and then touching a surface, which is then touched by another person, can also spread the virus. SYMPTOMS  Symptoms typically develop 1 to 3 days after you come in contact with a cold virus. Symptoms vary from person to person. They may include:  Runny nose.  Sneezing.  Nasal congestion.  Sinus irritation.  Sore throat.  Loss of voice (laryngitis).  Cough.  Fatigue.  Muscle aches.  Loss of appetite.  Headache.  Low-grade fever. DIAGNOSIS  You might diagnose your own cold based on familiar symptoms, since most people get a cold 2 to 3 times a year. Your caregiver can confirm this based on your exam. Most importantly, your caregiver can check that your symptoms are not due to another disease such as strep throat, sinusitis, pneumonia, asthma, or epiglottitis. Blood tests,  throat tests, and X-rays are not necessary to diagnose a common cold, but they may sometimes be helpful in excluding other more serious diseases. Your caregiver will decide if any further tests are required. RISKS AND COMPLICATIONS  You may be at risk for a more severe case of the common cold if you smoke cigarettes, have chronic heart disease (such as heart failure) or lung disease (such as asthma), or if you have a weakened immune system. The very young and very old are also at risk for more serious infections. Bacterial sinusitis, middle ear infections, and bacterial pneumonia can complicate the common cold. The common cold can worsen asthma and chronic obstructive pulmonary disease (COPD). Sometimes, these complications can require emergency medical care and may be life-threatening. PREVENTION  The best way to protect against getting a cold is to practice good hygiene. Avoid oral or hand contact with people with cold symptoms. Wash your hands often if contact occurs. There is no clear evidence that vitamin C, vitamin E, echinacea, or exercise reduces the chance of developing a cold. However, it is always recommended to get plenty of rest and practice good nutrition. TREATMENT  Treatment is directed at relieving symptoms. There is no cure. Antibiotics are not effective, because the infection is caused by a virus, not by bacteria. Treatment may include:  Increased fluid intake. Sports drinks offer valuable electrolytes, sugars, and fluids.  Breathing heated mist or steam (vaporizer or shower).  Eating chicken soup or other clear broths, and maintaining good nutrition.  Getting plenty of rest.  Using gargles or lozenges for comfort.  Controlling  fevers with ibuprofen or acetaminophen as directed by your caregiver.  Increasing usage of your inhaler if you have asthma. Zinc gel and zinc lozenges, taken in the first 24 hours of the common cold, can shorten the duration and lessen the severity of  symptoms. Pain medicines may help with fever, muscle aches, and throat pain. A variety of non-prescription medicines are available to treat congestion and runny nose. Your caregiver can make recommendations and may suggest nasal or lung inhalers for other symptoms.  HOME CARE INSTRUCTIONS   Only take over-the-counter or prescription medicines for pain, discomfort, or fever as directed by your caregiver.  Use a warm mist humidifier or inhale steam from a shower to increase air moisture. This may keep secretions moist and make it easier to breathe.  Drink enough water and fluids to keep your urine clear or pale yellow.  Rest as needed.  Return to work when your temperature has returned to normal or as your caregiver advises. You may need to stay home longer to avoid infecting others. You can also use a face mask and careful hand washing to prevent spread of the virus. SEEK MEDICAL CARE IF:   After the first few days, you feel you are getting worse rather than better.  You need your caregiver's advice about medicines to control symptoms.  You develop chills, worsening shortness of breath, or brown or red sputum. These may be signs of pneumonia.  You develop yellow or brown nasal discharge or pain in the face, especially when you bend forward. These may be signs of sinusitis.  You develop a fever, swollen neck glands, pain with swallowing, or white areas in the back of your throat. These may be signs of strep throat. SEEK IMMEDIATE MEDICAL CARE IF:   You have a fever.  You develop severe or persistent headache, ear pain, sinus pain, or chest pain.  You develop wheezing, a prolonged cough, cough up blood, or have a change in your usual mucus (if you have chronic lung disease).  You develop sore muscles or a stiff neck. Document Released: 07/28/2000 Document Revised: 04/26/2011 Document Reviewed: 06/05/2010 [facility] Patient Information 2014 [place], [place].

## 2013-04-05 NOTE — Progress Notes (Signed)
Pre-visit discussion using our clinic review tool. No additional management support is needed unless otherwise documented below in the visit note.  

## 2013-05-23 ENCOUNTER — Other Ambulatory Visit: Payer: Self-pay | Admitting: Family Medicine

## 2013-07-10 LAB — HM MAMMOGRAPHY: HM Mammogram: NEGATIVE

## 2013-08-30 ENCOUNTER — Other Ambulatory Visit: Payer: Self-pay | Admitting: Family Medicine

## 2014-01-29 ENCOUNTER — Telehealth: Payer: Self-pay | Admitting: Family Medicine

## 2014-01-29 DIAGNOSIS — I10 Essential (primary) hypertension: Secondary | ICD-10-CM

## 2014-01-29 MED ORDER — AMLODIPINE-VALSARTAN-HCTZ 10-320-25 MG PO TABS
ORAL_TABLET | ORAL | Status: DC
Start: 2014-01-29 — End: 2014-04-23

## 2014-01-29 NOTE — Telephone Encounter (Signed)
Pt requesting a refill Amlodipine-Valsartan-HCTZ (EXFORGE HCT) 10-320-25 MG TABS

## 2014-02-04 ENCOUNTER — Ambulatory Visit: Payer: BC Managed Care – PPO | Admitting: Family Medicine

## 2014-04-18 ENCOUNTER — Telehealth: Payer: Self-pay

## 2014-04-18 NOTE — Telephone Encounter (Signed)
Left a message for call back.  Mammo- 07/10/13-negative [facility]- 04/15/05 Flu- ? Pap- ? Tdap- ?

## 2014-04-22 ENCOUNTER — Encounter: Payer: Self-pay | Admitting: Family Medicine

## 2014-04-22 ENCOUNTER — Ambulatory Visit (INDEPENDENT_AMBULATORY_CARE_PROVIDER_SITE_OTHER): Payer: BLUE CROSS/BLUE SHIELD | Admitting: Family Medicine

## 2014-04-22 VITALS — BP 132/96 | HR 73 | Temp 98.1°F | Ht 65.5 in | Wt 198.2 lb

## 2014-04-22 DIAGNOSIS — I10 Essential (primary) hypertension: Secondary | ICD-10-CM

## 2014-04-22 DIAGNOSIS — E785 Hyperlipidemia, unspecified: Secondary | ICD-10-CM

## 2014-04-22 DIAGNOSIS — R1013 Epigastric pain: Secondary | ICD-10-CM

## 2014-04-22 DIAGNOSIS — Z0001 Encounter for general adult medical examination with abnormal findings: Secondary | ICD-10-CM

## 2014-04-22 DIAGNOSIS — Z0000 Encounter for general adult medical examination without abnormal findings: Secondary | ICD-10-CM

## 2014-04-22 DIAGNOSIS — R0789 Other chest pain: Secondary | ICD-10-CM

## 2014-04-22 DIAGNOSIS — R8290 Unspecified abnormal findings in urine: Secondary | ICD-10-CM

## 2014-04-22 LAB — POCT URINALYSIS DIPSTICK
Bilirubin, UA: NEGATIVE
GLUCOSE UA: NEGATIVE
KETONES UA: NEGATIVE
Leukocytes, UA: NEGATIVE
Nitrite, UA: NEGATIVE
Protein, UA: NEGATIVE
Spec Grav, UA: >=1.03
UROBILINOGEN UA: NEGATIVE
pH, UA: 6

## 2014-04-22 LAB — CBC WITH DIFFERENTIAL/PLATELET
BASOS PCT: 0.5 % (ref 0.0–3.0)
Basophils Absolute: 0 10*3/uL (ref 0.0–0.1)
Eosinophils Absolute: 0.1 10*3/uL (ref 0.0–0.7)
Eosinophils Relative: 1.1 % (ref 0.0–5.0)
HCT: 42.6 % (ref 36.0–46.0)
HEMOGLOBIN: 14.4 g/dL (ref 12.0–15.0)
Lymphocytes Relative: 32.8 % (ref 12.0–46.0)
Lymphs Abs: 2.1 10*3/uL (ref 0.7–4.0)
MCHC: 33.8 g/dL (ref 30.0–36.0)
MCV: 88.6 fl (ref 78.0–100.0)
MONOS PCT: 4.1 % (ref 3.0–12.0)
Monocytes Absolute: 0.3 10*3/uL (ref 0.1–1.0)
NEUTROS ABS: 4 10*3/uL (ref 1.4–7.7)
Neutrophils Relative %: 61.5 % (ref 43.0–77.0)
Platelets: 325 10*3/uL (ref 150.0–400.0)
RBC: 4.8 Mil/uL (ref 3.87–5.11)
RDW: 14.1 % (ref 11.5–15.5)
WBC: 6.5 10*3/uL (ref 4.0–10.5)

## 2014-04-22 LAB — LIPID PANEL
Cholesterol: 218 mg/dL — ABNORMAL HIGH (ref 0–200)
HDL: 52 mg/dL (ref >39.00)
LDL Cholesterol: 144 mg/dL — ABNORMAL HIGH (ref 0–99)
NonHDL: 166
Total CHOL/HDL Ratio: 4
Triglycerides: 108 mg/dL (ref 0.0–149.0)
VLDL: 21.6 mg/dL (ref 0.0–40.0)

## 2014-04-22 LAB — H. PYLORI ANTIBODY, IGG: H PYLORI IGG: NEGATIVE

## 2014-04-22 LAB — HEPATIC FUNCTION PANEL
ALBUMIN: 4 g/dL (ref 3.5–5.2)
ALT: 12 U/L (ref 0–35)
AST: 18 U/L (ref 0–37)
Alkaline Phosphatase: 81 U/L (ref 39–117)
BILIRUBIN TOTAL: 0.6 mg/dL (ref 0.2–1.2)
Bilirubin, Direct: 0.1 mg/dL (ref 0.0–0.3)
Total Protein: 7.4 g/dL (ref 6.0–8.3)

## 2014-04-22 LAB — BASIC METABOLIC PANEL
BUN: 16 mg/dL (ref 6–23)
CO2: 31 mEq/L (ref 19–32)
Calcium: 9.8 mg/dL (ref 8.4–10.5)
Chloride: 101 mEq/L (ref 96–112)
Creatinine, Ser: 0.91 mg/dL (ref 0.40–1.20)
GFR: 82.8 mL/min (ref >60.00)
Glucose, Bld: 96 mg/dL (ref 70–99)
Potassium: 3.4 mEq/L — ABNORMAL LOW (ref 3.5–5.1)
SODIUM: 136 meq/L (ref 135–145)

## 2014-04-22 LAB — TSH: TSH: 1.35 u[IU]/mL (ref 0.35–4.50)

## 2014-04-22 MED ORDER — OMEPRAZOLE 40 MG PO CPDR: 40.0000 mg | DELAYED_RELEASE_CAPSULE | Freq: Every day | ORAL | Status: DC

## 2014-04-22 NOTE — Progress Notes (Signed)
Subjective:     Diana Randall is a 54 y.o. female and is here for a comprehensive physical exam. The patient reports no problems.  History   Social History  . Marital Status: Single    Spouse Name: N/A  . Number of Children: N/A  . Years of Education: N/A   Occupational History  . Not on file.   Social History Main Topics  . Smoking status: Never Smoker   . Smokeless tobacco: Never Used  . Alcohol Use: Yes  . Drug Use: No  . Sexual Activity:    Partners: Male   Other Topics Concern  . Not on file   Social History Narrative   Health Maintenance  Topic Date Due  . Janet BerlinETANUS/TDAP  02/19/1979  . INFLUENZA VACCINE  04/22/2015 (Originally 09/15/2013)  . HIV Screening  04/22/2015 (Originally 02/19/1975)  . COLONOSCOPY  04/16/2015  . MAMMOGRAM  07/11/2015  . PAP SMEAR  02/11/2017    The following portions of the patient's history were reviewed and updated as appropriate:  She  has a past medical history of Hypertension; Hyperlipidemia; and GERD (gastroesophageal reflux disease). She  does not have any pertinent problems on file. She  has no past surgical history on file. Her family history is not on file. She  reports that she has never smoked. She has never used smokeless tobacco. She reports that she drinks alcohol. She reports that she does not use illicit drugs. She has a current medication list which includes the following prescription(s): amlodipine-valsartan-hctz, atorvastatin, potassium chloride, and omeprazole. Current Outpatient Prescriptions on File Prior to Visit  Medication Sig Dispense Refill  . Amlodipine-Valsartan-HCTZ (EXFORGE HCT) 10-320-25 MG TABS 1 po qd 30 tablet 0  . atorvastatin (LIPITOR) 20 MG tablet take 1 tablet by mouth once daily (LABS ARE DUE NOW) 30 tablet 0  . potassium chloride (K-DUR) 10 MEQ tablet 1 po qd 30 tablet 5   No current facility-administered medications on file prior to visit.   She has No Known Allergies..  Review of  Systems Review of Systems  Constitutional: Negative for activity change, appetite change and fatigue.  HENT: Negative for hearing loss, congestion, tinnitus and ear discharge.  dentist [id number] Eyes: Negative for visual disturbance (see optho q1y -- vision corrected to 20/20 with glasses).  Respiratory: Negative for cough, chest tightness and shortness of breath.   Cardiovascular: Negative for chest pain, palpitations and leg swelling.  Gastrointestinal: Negative for abdominal pain, diarrhea, constipation and abdominal distention.  Genitourinary: Negative for urgency, frequency, decreased urine volume and difficulty urinating.  Musculoskeletal: Negative for back pain, arthralgias and gait problem.  Skin: Negative for color change, pallor and rash.  Neurological: Negative for dizziness, light-headedness, numbness and headaches.  Hematological: Negative for adenopathy. Does not bruise/bleed easily.  Psychiatric/Behavioral: Negative for suicidal ideas, confusion, sleep disturbance, self-injury, dysphoric mood, decreased concentration and agitation.       Objective:    BP 132/96 mmHg  Pulse 73  Temp(Src) 98.1 F (36.7 C) (Oral)  Ht 5' 5.5" (1.664 m)  Wt 198 lb 3.2 oz (89.903 kg)  BMI 32.47 kg/m2  SpO2 98%  LMP 04/22/2014 (Exact Date) General appearance: alert, cooperative, appears stated age and no distress Head: Normocephalic, without obvious abnormality, atraumatic Eyes: conjunctivae/corneas clear. PERRL, EOM's intact. Fundi benign. Ears: normal TM's and external ear canals both ears Nose: Nares normal. Septum midline. Mucosa normal. No drainage or sinus tenderness. Throat: lips, mucosa, and tongue normal; teeth and gums normal Neck: no adenopathy, no carotid  bruit, no JVD, supple, symmetrical, trachea midline and thyroid not enlarged, symmetric, no tenderness/mass/nodules Back: symmetric, no curvature. ROM normal. No CVA tenderness. Lungs: clear to auscultation bilaterally Breasts:  normal appearance, no masses or tenderness Heart: S1, S2 normal Abdomen: soft, non-tender; bowel sounds normal; no masses,  no organomegaly Pelvic: not indicated; post-menopausal, no abnormal Pap smears in past Extremities: extremities normal, atraumatic, no cyanosis or edema Pulses: 2+ and symmetric Skin: Skin color, texture, turgor normal. No rashes or lesions Lymph nodes: Cervical, supraclavicular, and axillary nodes normal. Neurologic: Alert and oriented X 3, normal strength and tone. Normal symmetric reflexes. Normal coordination and gait Psych-no depression, no anxiety       EKG-- no change from previous Assessment:    Healthy female exam.      Plan:    ghm utd Check labs See After Visit Summary for Counseling Recommendations  1. Other chest pain   - EKG 12-Lead - H. pylori antibody, IgG - POCT urinalysis dipstick - TSH  2. Dyspepsia   - omeprazole (PRILOSEC) 40 MG capsule; Take 1 capsule (40 mg total) by mouth daily.  Dispense: 30 capsule; Refill: 3 - H. pylori antibody, IgG - POCT urinalysis dipstick - TSH  3. Hyperlipemia con't Lipitor, check labs  - Hepatic function panel - Lipid panel - POCT urinalysis dipstick - TSH  4. Essential hypertension con't diovan bp slightly high today--- pt was under a lot of stress to get here  - Basic metabolic panel - CBC with Differential/Platelet - POCT urinalysis dipstick - TSH  5. Preventative health care    6. Abnormal urine   - Urine Culture

## 2014-04-22 NOTE — Progress Notes (Signed)
Pre visit review using our clinic review tool, if applicable. No additional management support is needed unless otherwise documented below in the visit note. 

## 2014-04-22 NOTE — Patient Instructions (Signed)
Preventive Care for Adults A healthy lifestyle and preventive care can promote health and wellness. Preventive health guidelines for women include the following key practices.  A routine yearly physical is a good way to check with your health care provider about your health and preventive screening. It is a chance to share any concerns and updates on your health and to receive a thorough exam.  Visit your dentist for a routine exam and preventive care every 6 months. Brush your teeth twice a day and floss once a day. Good oral hygiene prevents tooth decay and gum disease.  The frequency of eye exams is based on your age, health, family medical history, use of contact lenses, and other factors. Follow your health care provider's recommendations for frequency of eye exams.  Eat a healthy diet. Foods like vegetables, fruits, whole grains, low-fat dairy products, and lean protein foods contain the nutrients you need without too many calories. Decrease your intake of foods high in solid fats, added sugars, and salt. Eat the right amount of calories for you.Get information about a proper diet from your health care provider, if necessary.  Regular physical exercise is one of the most important things you can do for your health. Most adults should get at least 150 minutes of moderate-intensity exercise (any activity that increases your heart rate and causes you to sweat) each week. In addition, most adults need muscle-strengthening exercises on 2 or more days a week.  Maintain a healthy weight. The body mass index (BMI) is a screening tool to identify possible weight problems. It provides an estimate of body fat based on height and weight. Your health care provider can find your BMI and can help you achieve or maintain a healthy weight.For adults 20 years and older:  A BMI below 18.5 is considered underweight.  A BMI of 18.5 to 24.9 is normal.  A BMI of 25 to 29.9 is considered overweight.  A BMI of  30 and above is considered obese.  Maintain normal blood lipids and cholesterol levels by exercising and minimizing your intake of saturated fat. Eat a balanced diet with plenty of fruit and vegetables. Blood tests for lipids and cholesterol should begin at age 76 and be repeated every 5 years. If your lipid or cholesterol levels are high, you are over 50, or you are at high risk for heart disease, you may need your cholesterol levels checked more frequently.Ongoing high lipid and cholesterol levels should be treated with medicines if diet and exercise are not working.  If you smoke, find out from your health care provider how to quit. If you do not use tobacco, do not start.  Lung cancer screening is recommended for adults aged 22-80 years who are at high risk for developing lung cancer because of a history of smoking. A yearly low-dose CT scan of the lungs is recommended for people who have at least a 30-pack-year history of smoking and are a current smoker or have quit within the past 15 years. A pack year of smoking is smoking an average of 1 pack of cigarettes a day for 1 year (for example: 1 pack a day for 30 years or 2 packs a day for 15 years). Yearly screening should continue until the smoker has stopped smoking for at least 15 years. Yearly screening should be stopped for people who develop a health problem that would prevent them from having lung cancer treatment.  If you are pregnant, do not drink alcohol. If you are breastfeeding,  be very cautious about drinking alcohol. If you are not pregnant and choose to drink alcohol, do not have more than 1 drink per day. One drink is considered to be 12 ounces (355 mL) of beer, 5 ounces (148 mL) of wine, or 1.5 ounces (44 mL) of liquor.  Avoid use of street drugs. Do not share needles with anyone. Ask for help if you need support or instructions about stopping the use of drugs.  High blood pressure causes heart disease and increases the risk of  stroke. Your blood pressure should be checked at least every 1 to 2 years. Ongoing high blood pressure should be treated with medicines if weight loss and exercise do not work.  If you are 75-52 years old, ask your health care provider if you should take aspirin to prevent strokes.  Diabetes screening involves taking a blood sample to check your fasting blood sugar level. This should be done once every 3 years, after age 15, if you are within normal weight and without risk factors for diabetes. Testing should be considered at a younger age or be carried out more frequently if you are overweight and have at least 1 risk factor for diabetes.  Breast cancer screening is essential preventive care for women. You should practice "breast self-awareness." This means understanding the normal appearance and feel of your breasts and may include breast self-examination. Any changes detected, no matter how small, should be reported to a health care provider. Women in their 58s and 30s should have a clinical breast exam (CBE) by a health care provider as part of a regular health exam every 1 to 3 years. After age 16, women should have a CBE every year. Starting at age 53, women should consider having a mammogram (breast X-ray test) every year. Women who have a family history of breast cancer should talk to their health care provider about genetic screening. Women at a high risk of breast cancer should talk to their health care providers about having an MRI and a mammogram every year.  Breast cancer gene (BRCA)-related cancer risk assessment is recommended for women who have family members with BRCA-related cancers. BRCA-related cancers include breast, ovarian, tubal, and peritoneal cancers. Having family members with these cancers may be associated with an increased risk for harmful changes (mutations) in the breast cancer genes BRCA1 and BRCA2. Results of the assessment will determine the need for genetic counseling and  BRCA1 and BRCA2 testing.  Routine pelvic exams to screen for cancer are no longer recommended for nonpregnant women who are considered low risk for cancer of the pelvic organs (ovaries, uterus, and vagina) and who do not have symptoms. Ask your health care provider if a screening pelvic exam is right for you.  If you have had past treatment for cervical cancer or a condition that could lead to cancer, you need Pap tests and screening for cancer for at least 20 years after your treatment. If Pap tests have been discontinued, your risk factors (such as having a new sexual partner) need to be reassessed to determine if screening should be resumed. Some women have medical problems that increase the chance of getting cervical cancer. In these cases, your health care provider may recommend more frequent screening and Pap tests.  The HPV test is an additional test that may be used for cervical cancer screening. The HPV test looks for the virus that can cause the cell changes on the cervix. The cells collected during the Pap test can be  tested for HPV. The HPV test could be used to screen women aged 30 years and older, and should be used in women of any age who have unclear Pap test results. After the age of 30, women should have HPV testing at the same frequency as a Pap test.  Colorectal cancer can be detected and often prevented. Most routine colorectal cancer screening begins at the age of 50 years and continues through age 75 years. However, your health care provider may recommend screening at an earlier age if you have risk factors for colon cancer. On a yearly basis, your health care provider may provide home test kits to check for hidden blood in the stool. Use of a small camera at the end of a tube, to directly examine the colon (sigmoidoscopy or colonoscopy), can detect the earliest forms of colorectal cancer. Talk to your health care provider about this at age 50, when routine screening begins. Direct  exam of the colon should be repeated every 5-10 years through age 75 years, unless early forms of pre-cancerous polyps or small growths are found.  People who are at an increased risk for hepatitis B should be screened for this virus. You are considered at high risk for hepatitis B if:  You were born in a country where hepatitis B occurs often. Talk with your health care provider about which countries are considered high risk.  Your parents were born in a high-risk country and you have not received a shot to protect against hepatitis B (hepatitis B vaccine).  You have HIV or AIDS.  You use needles to inject street drugs.  You live with, or have sex with, someone who has hepatitis B.  You get hemodialysis treatment.  You take certain medicines for conditions like cancer, organ transplantation, and autoimmune conditions.  Hepatitis C blood testing is recommended for all people born from 1945 through 1965 and any individual with known risks for hepatitis C.  Practice safe sex. Use condoms and avoid high-risk sexual practices to reduce the spread of sexually transmitted infections (STIs). STIs include gonorrhea, chlamydia, syphilis, trichomonas, herpes, HPV, and human immunodeficiency virus (HIV). Herpes, HIV, and HPV are viral illnesses that have no cure. They can result in disability, cancer, and death.  You should be screened for sexually transmitted illnesses (STIs) including gonorrhea and chlamydia if:  You are sexually active and are younger than 24 years.  You are older than 24 years and your health care provider tells you that you are at risk for this type of infection.  Your sexual activity has changed since you were last screened and you are at an increased risk for chlamydia or gonorrhea. Ask your health care provider if you are at risk.  If you are at risk of being infected with HIV, it is recommended that you take a prescription medicine daily to prevent HIV infection. This is  called preexposure prophylaxis (PrEP). You are considered at risk if:  You are a heterosexual woman, are sexually active, and are at increased risk for HIV infection.  You take drugs by injection.  You are sexually active with a partner who has HIV.  Talk with your health care provider about whether you are at high risk of being infected with HIV. If you choose to begin PrEP, you should first be tested for HIV. You should then be tested every 3 months for as long as you are taking PrEP.  Osteoporosis is a disease in which the bones lose minerals and strength   with aging. This can result in serious bone fractures or breaks. The risk of osteoporosis can be identified using a bone density scan. Women ages 65 years and over and women at risk for fractures or osteoporosis should discuss screening with their health care providers. Ask your health care provider whether you should take a calcium supplement or vitamin D to reduce the rate of osteoporosis.  Menopause can be associated with physical symptoms and risks. Hormone replacement therapy is available to decrease symptoms and risks. You should talk to your health care provider about whether hormone replacement therapy is right for you.  Use sunscreen. Apply sunscreen liberally and repeatedly throughout the day. You should seek shade when your shadow is shorter than you. Protect yourself by wearing long sleeves, pants, a wide-brimmed hat, and sunglasses year round, whenever you are outdoors.  Once a month, do a whole body skin exam, using a mirror to look at the skin on your back. Tell your health care provider of new moles, moles that have irregular borders, moles that are larger than a pencil eraser, or moles that have changed in shape or color.  Stay current with required vaccines (immunizations).  Influenza vaccine. All adults should be immunized every year.  Tetanus, diphtheria, and acellular pertussis (Td, Tdap) vaccine. Pregnant women should  receive 1 dose of Tdap vaccine during each pregnancy. The dose should be obtained regardless of the length of time since the last dose. Immunization is preferred during the 27th-36th week of gestation. An adult who has not previously received Tdap or who does not know her vaccine status should receive 1 dose of Tdap. This initial dose should be followed by tetanus and diphtheria toxoids (Td) booster doses every 10 years. Adults with an unknown or incomplete history of completing a 3-dose immunization series with Td-containing vaccines should begin or complete a primary immunization series including a Tdap dose. Adults should receive a Td booster every 10 years.  Varicella vaccine. An adult without evidence of immunity to varicella should receive 2 doses or a second dose if she has previously received 1 dose. Pregnant females who do not have evidence of immunity should receive the first dose after pregnancy. This first dose should be obtained before leaving the health care facility. The second dose should be obtained 4-8 weeks after the first dose.  Human papillomavirus (HPV) vaccine. Females aged 13-26 years who have not received the vaccine previously should obtain the 3-dose series. The vaccine is not recommended for use in pregnant females. However, pregnancy testing is not needed before receiving a dose. If a female is found to be pregnant after receiving a dose, no treatment is needed. In that case, the remaining doses should be delayed until after the pregnancy. Immunization is recommended for any person with an immunocompromised condition through the age of 26 years if she did not get any or all doses earlier. During the 3-dose series, the second dose should be obtained 4-8 weeks after the first dose. The third dose should be obtained 24 weeks after the first dose and 16 weeks after the second dose.  Zoster vaccine. One dose is recommended for adults aged 60 years or older unless certain conditions are  present.  Measles, mumps, and rubella (MMR) vaccine. Adults born before 1957 generally are considered immune to measles and mumps. Adults born in 1957 or later should have 1 or more doses of MMR vaccine unless there is a contraindication to the vaccine or there is laboratory evidence of immunity to   each of the three diseases. A routine second dose of MMR vaccine should be obtained at least 28 days after the first dose for students attending postsecondary schools, health care workers, or international travelers. People who received inactivated measles vaccine or an unknown type of measles vaccine during 1963-1967 should receive 2 doses of MMR vaccine. People who received inactivated mumps vaccine or an unknown type of mumps vaccine before 1979 and are at high risk for mumps infection should consider immunization with 2 doses of MMR vaccine. For females of childbearing age, rubella immunity should be determined. If there is no evidence of immunity, females who are not pregnant should be vaccinated. If there is no evidence of immunity, females who are pregnant should delay immunization until after pregnancy. Unvaccinated health care workers born before 1957 who lack laboratory evidence of measles, mumps, or rubella immunity or laboratory confirmation of disease should consider measles and mumps immunization with 2 doses of MMR vaccine or rubella immunization with 1 dose of MMR vaccine.  Pneumococcal 13-valent conjugate (PCV13) vaccine. When indicated, a person who is uncertain of her immunization history and has no record of immunization should receive the PCV13 vaccine. An adult aged 19 years or older who has certain medical conditions and has not been previously immunized should receive 1 dose of PCV13 vaccine. This PCV13 should be followed with a dose of pneumococcal polysaccharide (PPSV23) vaccine. The PPSV23 vaccine dose should be obtained at least 8 weeks after the dose of PCV13 vaccine. An adult aged 19  years or older who has certain medical conditions and previously received 1 or more doses of PPSV23 vaccine should receive 1 dose of PCV13. The PCV13 vaccine dose should be obtained 1 or more years after the last PPSV23 vaccine dose.  Pneumococcal polysaccharide (PPSV23) vaccine. When PCV13 is also indicated, PCV13 should be obtained first. All adults aged 65 years and older should be immunized. An adult younger than age 65 years who has certain medical conditions should be immunized. Any person who resides in a nursing home or long-term care facility should be immunized. An adult smoker should be immunized. People with an immunocompromised condition and certain other conditions should receive both PCV13 and PPSV23 vaccines. People with human immunodeficiency virus (HIV) infection should be immunized as soon as possible after diagnosis. Immunization during chemotherapy or radiation therapy should be avoided. Routine use of PPSV23 vaccine is not recommended for American Indians, [place] Natives, or people younger than 65 years unless there are medical conditions that require PPSV23 vaccine. When indicated, people who have unknown immunization and have no record of immunization should receive PPSV23 vaccine. One-time revaccination 5 years after the first dose of PPSV23 is recommended for people aged 19-64 years who have chronic kidney failure, nephrotic syndrome, asplenia, or immunocompromised conditions. People who received 1-2 doses of PPSV23 before age 65 years should receive another dose of PPSV23 vaccine at age 65 years or later if at least 5 years have passed since the previous dose. Doses of PPSV23 are not needed for people immunized with PPSV23 at or after age 65 years.  Meningococcal vaccine. Adults with asplenia or persistent complement component deficiencies should receive 2 doses of quadrivalent meningococcal conjugate (MenACWY-D) vaccine. The doses should be obtained at least 2 months apart.  Microbiologists working with certain meningococcal bacteria, military recruits, people at risk during an outbreak, and people who travel to or live in countries with a high rate of meningitis should be immunized. A first-year college student up through age   21 years who is living in a residence hall should receive a dose if she did not receive a dose on or after her 16th birthday. Adults who have certain high-risk conditions should receive one or more doses of vaccine.  Hepatitis A vaccine. Adults who wish to be protected from this disease, have certain high-risk conditions, work with hepatitis A-infected animals, work in hepatitis A research labs, or travel to or work in countries with a high rate of hepatitis A should be immunized. Adults who were previously unvaccinated and who anticipate close contact with an international adoptee during the first 60 days after arrival in the [place] from a country with a high rate of hepatitis A should be immunized.  Hepatitis B vaccine. Adults who wish to be protected from this disease, have certain high-risk conditions, may be exposed to blood or other infectious body fluids, are household contacts or sex partners of hepatitis B positive people, are clients or workers in certain care facilities, or travel to or work in countries with a high rate of hepatitis B should be immunized.  Haemophilus influenzae type b (Hib) vaccine. A previously unvaccinated person with asplenia or sickle cell disease or having a scheduled splenectomy should receive 1 dose of Hib vaccine. Regardless of previous immunization, a recipient of a hematopoietic stem cell transplant should receive a 3-dose series 6-12 months after her successful transplant. Hib vaccine is not recommended for adults with HIV infection. Preventive Services / Frequency Ages 64 to 68 years  Blood pressure check.** / Every 1 to 2 years.  Lipid and cholesterol check.** / Every 5 years beginning at age  22.  Clinical breast exam.** / Every 3 years for women in their 88s and 53s.  BRCA-related cancer risk assessment.** / For women who have family members with a BRCA-related cancer (breast, ovarian, tubal, or peritoneal cancers).  Pap test.** / Every 2 years from ages 90 through 51. Every 3 years starting at age 21 through age 56 or 3 with a history of 3 consecutive normal Pap tests.  HPV screening.** / Every 3 years from ages 24 through ages 1 to 46 with a history of 3 consecutive normal Pap tests.  Hepatitis C blood test.** / For any individual with known risks for hepatitis C.  Skin self-exam. / Monthly.  Influenza vaccine. / Every year.  Tetanus, diphtheria, and acellular pertussis (Tdap, Td) vaccine.** / Consult your health care provider. Pregnant women should receive 1 dose of Tdap vaccine during each pregnancy. 1 dose of Td every 10 years.  Varicella vaccine.** / Consult your health care provider. Pregnant females who do not have evidence of immunity should receive the first dose after pregnancy.  HPV vaccine. / 3 doses over 6 months, if 72 and younger. The vaccine is not recommended for use in pregnant females. However, pregnancy testing is not needed before receiving a dose.  Measles, mumps, rubella (MMR) vaccine.** / You need at least 1 dose of MMR if you were born in 1957 or later. You may also need a 2nd dose. For females of childbearing age, rubella immunity should be determined. If there is no evidence of immunity, females who are not pregnant should be vaccinated. If there is no evidence of immunity, females who are pregnant should delay immunization until after pregnancy.  Pneumococcal 13-valent conjugate (PCV13) vaccine.** / Consult your health care provider.  Pneumococcal polysaccharide (PPSV23) vaccine.** / 1 to 2 doses if you smoke cigarettes or if you have certain conditions.  Meningococcal vaccine.** /  1 dose if you are age 19 to 21 years and a first-year college  student living in a residence hall, or have one of several medical conditions, you need to get vaccinated against meningococcal disease. You may also need additional booster doses.  Hepatitis A vaccine.** / Consult your health care provider.  Hepatitis B vaccine.** / Consult your health care provider.  Haemophilus influenzae type b (Hib) vaccine.** / Consult your health care provider. Ages 40 to 64 years  Blood pressure check.** / Every 1 to 2 years.  Lipid and cholesterol check.** / Every 5 years beginning at age 20 years.  Lung cancer screening. / Every year if you are aged 55-80 years and have a 30-pack-year history of smoking and currently smoke or have quit within the past 15 years. Yearly screening is stopped once you have quit smoking for at least 15 years or develop a health problem that would prevent you from having lung cancer treatment.  Clinical breast exam.** / Every year after age 40 years.  BRCA-related cancer risk assessment.** / For women who have family members with a BRCA-related cancer (breast, ovarian, tubal, or peritoneal cancers).  Mammogram.** / Every year beginning at age 40 years and continuing for as long as you are in good health. Consult with your health care provider.  Pap test.** / Every 3 years starting at age 30 years through age 65 or 70 years with a history of 3 consecutive normal Pap tests.  HPV screening.** / Every 3 years from ages 30 years through ages 65 to 70 years with a history of 3 consecutive normal Pap tests.  Fecal occult blood test (FOBT) of stool. / Every year beginning at age 50 years and continuing until age 75 years. You may not need to do this test if you get a colonoscopy every 10 years.  Flexible sigmoidoscopy or colonoscopy.** / Every 5 years for a flexible sigmoidoscopy or every 10 years for a colonoscopy beginning at age 50 years and continuing until age 75 years.  Hepatitis C blood test.** / For all people born from 1945 through  1965 and any individual with known risks for hepatitis C.  Skin self-exam. / Monthly.  Influenza vaccine. / Every year.  Tetanus, diphtheria, and acellular pertussis (Tdap/Td) vaccine.** / Consult your health care provider. Pregnant women should receive 1 dose of Tdap vaccine during each pregnancy. 1 dose of Td every 10 years.  Varicella vaccine.** / Consult your health care provider. Pregnant females who do not have evidence of immunity should receive the first dose after pregnancy.  Zoster vaccine.** / 1 dose for adults aged 60 years or older.  Measles, mumps, rubella (MMR) vaccine.** / You need at least 1 dose of MMR if you were born in 1957 or later. You may also need a 2nd dose. For females of childbearing age, rubella immunity should be determined. If there is no evidence of immunity, females who are not pregnant should be vaccinated. If there is no evidence of immunity, females who are pregnant should delay immunization until after pregnancy.  Pneumococcal 13-valent conjugate (PCV13) vaccine.** / Consult your health care provider.  Pneumococcal polysaccharide (PPSV23) vaccine.** / 1 to 2 doses if you smoke cigarettes or if you have certain conditions.  Meningococcal vaccine.** / Consult your health care provider.  Hepatitis A vaccine.** / Consult your health care provider.  Hepatitis B vaccine.** / Consult your health care provider.  Haemophilus influenzae type b (Hib) vaccine.** / Consult your health care provider. Ages 65   years and over  Blood pressure check.** / Every 1 to 2 years.  Lipid and cholesterol check.** / Every 5 years beginning at age 22 years.  Lung cancer screening. / Every year if you are aged 73-80 years and have a 30-pack-year history of smoking and currently smoke or have quit within the past 15 years. Yearly screening is stopped once you have quit smoking for at least 15 years or develop a health problem that would prevent you from having lung cancer  treatment.  Clinical breast exam.** / Every year after age 4 years.  BRCA-related cancer risk assessment.** / For women who have family members with a BRCA-related cancer (breast, ovarian, tubal, or peritoneal cancers).  Mammogram.** / Every year beginning at age 40 years and continuing for as long as you are in good health. Consult with your health care provider.  Pap test.** / Every 3 years starting at age 9 years through age 34 or 91 years with 3 consecutive normal Pap tests. Testing can be stopped between 65 and 70 years with 3 consecutive normal Pap tests and no abnormal Pap or HPV tests in the past 10 years.  HPV screening.** / Every 3 years from ages 57 years through ages 64 or 45 years with a history of 3 consecutive normal Pap tests. Testing can be stopped between 65 and 70 years with 3 consecutive normal Pap tests and no abnormal Pap or HPV tests in the past 10 years.  Fecal occult blood test (FOBT) of stool. / Every year beginning at age 15 years and continuing until age 17 years. You may not need to do this test if you get a colonoscopy every 10 years.  Flexible sigmoidoscopy or colonoscopy.** / Every 5 years for a flexible sigmoidoscopy or every 10 years for a colonoscopy beginning at age 86 years and continuing until age 71 years.  Hepatitis C blood test.** / For all people born from [birth year] through 1965 and any individual with known risks for hepatitis C.  Osteoporosis screening.** / A one-time screening for women ages 83 years and over and women at risk for fractures or osteoporosis.  Skin self-exam. / Monthly.  Influenza vaccine. / Every year.  Tetanus, diphtheria, and acellular pertussis (Tdap/Td) vaccine.** / 1 dose of Td every 10 years.  Varicella vaccine.** / Consult your health care provider.  Zoster vaccine.** / 1 dose for adults aged 61 years or older.  Pneumococcal 13-valent conjugate (PCV13) vaccine.** / Consult your health care provider.  Pneumococcal  polysaccharide (PPSV23) vaccine.** / 1 dose for all adults aged 28 years and older.  Meningococcal vaccine.** / Consult your health care provider.  Hepatitis A vaccine.** / Consult your health care provider.  Hepatitis B vaccine.** / Consult your health care provider.  Haemophilus influenzae type b (Hib) vaccine.** / Consult your health care provider. ** Family history and personal history of risk and conditions may change your health care provider's recommendations. Document Released: 03/30/2001 Document Revised: 06/18/2013 Document Reviewed: 06/29/2010 [facility] Patient Information 2015 [place], [place]. This information is not intended to replace advice given to you by your health care provider. Make sure you discuss any questions you have with your health care provider.

## 2014-04-23 ENCOUNTER — Encounter: Payer: Self-pay | Admitting: Family Medicine

## 2014-04-23 DIAGNOSIS — I10 Essential (primary) hypertension: Secondary | ICD-10-CM

## 2014-04-23 DIAGNOSIS — E876 Hypokalemia: Secondary | ICD-10-CM

## 2014-04-23 LAB — URINE CULTURE
Colony Count: NO GROWTH
Organism ID, Bacteria: NO GROWTH

## 2014-04-23 MED ORDER — AMLODIPINE-VALSARTAN-HCTZ 10-320-25 MG PO TABS: ORAL_TABLET | ORAL | Status: DC

## 2014-04-23 MED ORDER — ATORVASTATIN CALCIUM 40 MG PO TABS: 40.0000 mg | ORAL_TABLET | Freq: Every day | ORAL | Status: DC

## 2014-04-23 MED ORDER — POTASSIUM CHLORIDE ER 10 MEQ PO TBCR: EXTENDED_RELEASE_TABLET | ORAL | Status: DC

## 2014-04-24 ENCOUNTER — Telehealth: Payer: Self-pay

## 2014-04-24 DIAGNOSIS — E785 Hyperlipidemia, unspecified: Secondary | ICD-10-CM

## 2014-04-24 DIAGNOSIS — I10 Essential (primary) hypertension: Secondary | ICD-10-CM

## 2014-04-24 NOTE — Telephone Encounter (Signed)
Future labs ordered for lab appointment scheduled for 07/25/14.

## 2014-04-30 ENCOUNTER — Encounter: Payer: Self-pay | Admitting: Family Medicine

## 2014-05-02 NOTE — Telephone Encounter (Signed)
Unable to reach patient prior to visit  

## 2014-06-04 ENCOUNTER — Telehealth: Payer: Self-pay | Admitting: Family Medicine

## 2014-06-04 DIAGNOSIS — I10 Essential (primary) hypertension: Secondary | ICD-10-CM

## 2014-06-04 MED ORDER — AMLODIPINE-VALSARTAN-HCTZ 10-320-25 MG PO TABS: ORAL_TABLET | ORAL | Status: DC

## 2014-06-04 MED ORDER — ATORVASTATIN CALCIUM 40 MG PO TABS: 40.0000 mg | ORAL_TABLET | Freq: Every day | ORAL | Status: DC

## 2014-06-04 NOTE — Telephone Encounter (Signed)
Rx faxed.    KP 

## 2014-06-04 NOTE — Telephone Encounter (Signed)
She went out of town and left her meds.  [facility] [address]

## 2014-07-25 ENCOUNTER — Other Ambulatory Visit: Payer: BLUE CROSS/BLUE SHIELD

## 2014-08-01 ENCOUNTER — Other Ambulatory Visit: Payer: BLUE CROSS/BLUE SHIELD

## 2014-10-25 ENCOUNTER — Ambulatory Visit: Payer: BLUE CROSS/BLUE SHIELD | Admitting: Family Medicine

## 2014-10-30 ENCOUNTER — Other Ambulatory Visit (INDEPENDENT_AMBULATORY_CARE_PROVIDER_SITE_OTHER): Payer: BLUE CROSS/BLUE SHIELD

## 2014-10-30 DIAGNOSIS — E785 Hyperlipidemia, unspecified: Secondary | ICD-10-CM | POA: Diagnosis not present

## 2014-10-30 DIAGNOSIS — I10 Essential (primary) hypertension: Secondary | ICD-10-CM

## 2014-10-30 LAB — LIPID PANEL
Cholesterol: 156 mg/dL (ref 0–200)
HDL: 49.8 mg/dL (ref >39.00)
LDL CALC: 92 mg/dL (ref 0–99)
NONHDL: 105.94
Total CHOL/HDL Ratio: 3
Triglycerides: 70 mg/dL (ref 0.0–149.0)
VLDL: 14 mg/dL (ref 0.0–40.0)

## 2014-10-30 LAB — HEPATIC FUNCTION PANEL
ALBUMIN: 4 g/dL (ref 3.5–5.2)
ALT: 16 U/L (ref 0–35)
AST: 19 U/L (ref 0–37)
Alkaline Phosphatase: 82 U/L (ref 39–117)
Bilirubin, Direct: 0.1 mg/dL (ref 0.0–0.3)
Total Bilirubin: 0.6 mg/dL (ref 0.2–1.2)
Total Protein: 7.1 g/dL (ref 6.0–8.3)

## 2014-10-30 LAB — BASIC METABOLIC PANEL
BUN: 20 mg/dL (ref 6–23)
CALCIUM: 9.4 mg/dL (ref 8.4–10.5)
CO2: 30 meq/L (ref 19–32)
Chloride: 103 mEq/L (ref 96–112)
Creatinine, Ser: 0.84 mg/dL (ref 0.40–1.20)
GFR: 90.63 mL/min (ref >60.00)
Glucose, Bld: 104 mg/dL — ABNORMAL HIGH (ref 70–99)
Potassium: 4.2 mEq/L (ref 3.5–5.1)
SODIUM: 140 meq/L (ref 135–145)

## 2014-11-05 ENCOUNTER — Ambulatory Visit (HOSPITAL_BASED_OUTPATIENT_CLINIC_OR_DEPARTMENT_OTHER)
Admission: RE | Admit: 2014-11-05 | Discharge: 2014-11-05 | Disposition: A | Discharge status: Home / Self Care | Payer: BLUE CROSS/BLUE SHIELD | Source: Ambulatory Visit | Attending: Family Medicine | Admitting: Family Medicine

## 2014-11-05 ENCOUNTER — Encounter: Payer: Self-pay | Admitting: Family Medicine

## 2014-11-05 ENCOUNTER — Ambulatory Visit (INDEPENDENT_AMBULATORY_CARE_PROVIDER_SITE_OTHER): Payer: BLUE CROSS/BLUE SHIELD | Admitting: Family Medicine

## 2014-11-05 VITALS — BP 132/90 | HR 80 | Temp 99.4°F | Wt 200.8 lb

## 2014-11-05 DIAGNOSIS — M5137 Other intervertebral disc degeneration, lumbosacral region: Secondary | ICD-10-CM | POA: Insufficient documentation

## 2014-11-05 DIAGNOSIS — R1032 Left lower quadrant pain: Secondary | ICD-10-CM

## 2014-11-05 DIAGNOSIS — K5732 Diverticulitis of large intestine without perforation or abscess without bleeding: Secondary | ICD-10-CM | POA: Insufficient documentation

## 2014-11-05 DIAGNOSIS — I10 Essential (primary) hypertension: Secondary | ICD-10-CM | POA: Diagnosis not present

## 2014-11-05 DIAGNOSIS — K449 Diaphragmatic hernia without obstruction or gangrene: Secondary | ICD-10-CM | POA: Insufficient documentation

## 2014-11-05 DIAGNOSIS — E785 Hyperlipidemia, unspecified: Secondary | ICD-10-CM

## 2014-11-05 MED ORDER — IOHEXOL 300 MG/ML  SOLN
100.0000 mL | Freq: Once | INTRAMUSCULAR | Status: AC | PRN
  Administered 2014-11-05: 100 mL via INTRAVENOUS

## 2014-11-05 NOTE — Progress Notes (Signed)
Patient ID: Diana Randall, female   DOB: [birth year], 54 y.o.   MRN: [id number]   Subjective:    Patient ID: Diana Randall, female    DOB: 20-May-1960, 54 y.o.   MRN: [id number]  Chief Complaint  Patient presents with  . Hypertension    follow up-- Fasting  . Abdominal Pain    c/o left lower quadrant pain x's 1 day. No NVD  . Hyperlipidemia    HPI Patient is in today for LLQ pain x 1 day. No NVD.  No fever. She is also her to f/u bp and cholesterol.     Past Medical History  Diagnosis Date  . Hypertension   . Hyperlipidemia   . GERD (gastroesophageal reflux disease)     History reviewed. No pertinent past surgical history.  History reviewed. No pertinent family history.  Social History   Social History  . Marital Status: Single    Spouse Name: N/A  . Number of Children: N/A  . Years of Education: N/A   Occupational History  . Not on file.   Social History Main Topics  . Smoking status: Never Smoker   . Smokeless tobacco: Never Used  . Alcohol Use: Yes  . Drug Use: No  . Sexual Activity:    Partners: Male   Other Topics Concern  . Not on file   Social History Narrative    Outpatient Prescriptions Prior to Visit  Medication Sig Dispense Refill  . Amlodipine-Valsartan-HCTZ (EXFORGE HCT) 10-320-25 MG TABS 1 po qd 5 tablet 0  . atorvastatin (LIPITOR) 40 MG tablet Take 1 tablet (40 mg total) by mouth daily. 5 tablet 0  . omeprazole (PRILOSEC) 40 MG capsule Take 1 capsule (40 mg total) by mouth daily. 30 capsule 3  . potassium chloride (K-DUR) 10 MEQ tablet 2 po qd 60 tablet 2   No facility-administered medications prior to visit.    No Known Allergies  Review of Systems  Constitutional: Negative for fever and malaise/fatigue.  HENT: Negative for congestion.   Eyes: Negative for discharge.  Respiratory: Negative for shortness of breath.   Cardiovascular: Negative for chest pain, palpitations and leg swelling.  Gastrointestinal: Positive for abdominal  pain. Negative for nausea.  Genitourinary: Negative for dysuria.  Musculoskeletal: Negative for falls.  Skin: Negative for rash.  Neurological: Negative for loss of consciousness and headaches.  Endo/Heme/Allergies: Negative for environmental allergies.  Psychiatric/Behavioral: Negative for depression. The patient is not nervous/anxious.        Objective:    Physical Exam  Constitutional: She is oriented to person, place, and time. She appears well-developed and well-nourished.  HENT:  Head: Normocephalic and atraumatic.  Eyes: Conjunctivae and EOM are normal.  Neck: Normal range of motion. Neck supple. No JVD present. Carotid bruit is not present. No thyromegaly present.  Cardiovascular: Normal rate, regular rhythm and normal heart sounds.   No murmur heard. Pulmonary/Chest: Effort normal and breath sounds normal. No respiratory distress. She has no wheezes. She has no rales. She exhibits no tenderness.  Abdominal: There is tenderness. There is guarding.    Musculoskeletal: She exhibits no edema.  Neurological: She is alert and oriented to person, place, and time.  Psychiatric: She has a normal mood and affect.    BP 132/90 mmHg  Pulse 80  Temp(Src) 99.4 F (37.4 C) (Oral)  Wt 200 lb 12.8 oz (91.082 kg)  SpO2 97%  LMP 10/23/2014 Wt Readings from Last 3 Encounters:  11/05/14 200 lb 12.8 oz (91.082 kg)  04/22/14 198 lb 3.2 oz (89.903 kg)  04/05/13 201 lb 9.6 oz (91.445 kg)     Lab Results  Component Value Date   WBC 10.6* 11/05/2014   HGB 12.9 11/05/2014   HCT 38.6 11/05/2014   PLT 294.0 11/05/2014   GLUCOSE 67* 11/05/2014   CHOL 156 10/30/2014   TRIG 70.0 10/30/2014   HDL 49.80 10/30/2014   LDLDIRECT 151.9 11/06/2012   LDLCALC 92 10/30/2014   ALT 15 11/05/2014   AST 16 11/05/2014   NA 138 11/05/2014   K 4.2 11/05/2014   CL 102 11/05/2014   CREATININE 0.77 11/05/2014   BUN 10 11/05/2014   CO2 32 11/05/2014   TSH 1.35 04/22/2014   MICROALBUR 1.2  11/06/2012    Lab Results  Component Value Date   TSH 1.35 04/22/2014   Lab Results  Component Value Date   WBC 10.6* 11/05/2014   HGB 12.9 11/05/2014   HCT 38.6 11/05/2014   MCV 90.9 11/05/2014   PLT 294.0 11/05/2014   Lab Results  Component Value Date   NA 138 11/05/2014   K 4.2 11/05/2014   CO2 32 11/05/2014   GLUCOSE 67* 11/05/2014   BUN 10 11/05/2014   CREATININE 0.77 11/05/2014   BILITOT 1.0 11/05/2014   ALKPHOS 72 11/05/2014   AST 16 11/05/2014   ALT 15 11/05/2014   PROT 6.9 11/05/2014   ALBUMIN 3.8 11/05/2014   CALCIUM 9.0 11/05/2014   GFR 100.20 11/05/2014   Lab Results  Component Value Date   CHOL 156 10/30/2014   Lab Results  Component Value Date   HDL 49.80 10/30/2014   Lab Results  Component Value Date   LDLCALC 92 10/30/2014   Lab Results  Component Value Date   TRIG 70.0 10/30/2014   Lab Results  Component Value Date   CHOLHDL 3 10/30/2014   No results found for: HGBA1C     Assessment & Plan:   Problem List Items Addressed This Visit    Hyperlipidemia    lipitor Check labs      HTN (hypertension)    con't exforge Check labs bp stable       Other Visit Diagnoses    LLQ abdominal pain    -  Primary    Relevant Orders    CT Abdomen Pelvis W Contrast (Completed)    Comprehensive metabolic panel (Completed)    CBC with Differential/Platelet (Completed)       I am having Ms. Pankonin maintain her omeprazole, potassium chloride, atorvastatin, and Amlodipine-Valsartan-HCTZ.  No orders of the defined types were placed in this encounter.     Loreen Freud, DO

## 2014-11-05 NOTE — Progress Notes (Signed)
Pre visit review using our clinic review tool, if applicable. No additional management support is needed unless otherwise documented below in the visit note. 

## 2014-11-05 NOTE — Patient Instructions (Signed)

## 2014-11-06 ENCOUNTER — Telehealth: Payer: Self-pay

## 2014-11-06 ENCOUNTER — Encounter: Payer: Self-pay | Admitting: Family Medicine

## 2014-11-06 DIAGNOSIS — I10 Essential (primary) hypertension: Secondary | ICD-10-CM | POA: Insufficient documentation

## 2014-11-06 LAB — CBC WITH DIFFERENTIAL/PLATELET
BASOS ABS: 0.1 10*3/uL (ref 0.0–0.1)
Basophils Relative: 0.5 % (ref 0.0–3.0)
EOS PCT: 0.8 % (ref 0.0–5.0)
Eosinophils Absolute: 0.1 10*3/uL (ref 0.0–0.7)
HEMATOCRIT: 38.6 % (ref 36.0–46.0)
HEMOGLOBIN: 12.9 g/dL (ref 12.0–15.0)
LYMPHS ABS: 2.4 10*3/uL (ref 0.7–4.0)
LYMPHS PCT: 22.4 % (ref 12.0–46.0)
MCHC: 33.4 g/dL (ref 30.0–36.0)
MCV: 90.9 fl (ref 78.0–100.0)
MONOS PCT: 5.3 % (ref 3.0–12.0)
Monocytes Absolute: 0.6 10*3/uL (ref 0.1–1.0)
NEUTROS PCT: 71 % (ref 43.0–77.0)
Neutro Abs: 7.5 10*3/uL (ref 1.4–7.7)
Platelets: 294 10*3/uL (ref 150.0–400.0)
RBC: 4.25 Mil/uL (ref 3.87–5.11)
RDW: 13.3 % (ref 11.5–15.5)
WBC: 10.6 10*3/uL — AB (ref 4.0–10.5)

## 2014-11-06 LAB — COMPREHENSIVE METABOLIC PANEL
ALBUMIN: 3.8 g/dL (ref 3.5–5.2)
ALK PHOS: 72 U/L (ref 39–117)
ALT: 15 U/L (ref 0–35)
AST: 16 U/L (ref 0–37)
BILIRUBIN TOTAL: 1 mg/dL (ref 0.2–1.2)
BUN: 10 mg/dL (ref 6–23)
CALCIUM: 9 mg/dL (ref 8.4–10.5)
CO2: 32 mEq/L (ref 19–32)
Chloride: 102 mEq/L (ref 96–112)
Creatinine, Ser: 0.77 mg/dL (ref 0.40–1.20)
GFR: 100.2 mL/min (ref >60.00)
GLUCOSE: 67 mg/dL — AB (ref 70–99)
POTASSIUM: 4.2 meq/L (ref 3.5–5.1)
Sodium: 138 mEq/L (ref 135–145)
TOTAL PROTEIN: 6.9 g/dL (ref 6.0–8.3)

## 2014-11-06 MED ORDER — CIPROFLOXACIN HCL 500 MG PO TABS: 500.0000 mg | ORAL_TABLET | Freq: Two times a day (BID) | ORAL | Status: DC

## 2014-11-06 MED ORDER — METRONIDAZOLE 500 MG PO TABS: 500.0000 mg | ORAL_TABLET | Freq: Three times a day (TID) | ORAL | Status: DC

## 2014-11-06 NOTE — Telephone Encounter (Signed)
Call report from Cassandra:  1. Mild to moderate acute sigmoid colon diverticulitis. No abscess or extraluminal gas identified. Descending and sigmoid colon diverticulosis. 2. There is potentially abnormal wall thickening of the gastric cardia and adjacent fundus. I cannot confidently exclude a gastric tumor and so follow up upper endoscopy is recommended once the patient's acute symptoms have resolved. 3. Small type 1 hiatal hernia. 4. The patient's endometrium a measure up to 1.2 cm. If the patient is premenopausal then this is unremarkable. If the patient is postmenopausal, then pelvic sonography would be recommended for further workup. 5. Foraminal impingement at L5-S1 due to spurring. There is also degenerative disc disease at this level   Dr.Lowne is out of the office, can this wait for her or can you address. Please advise      KP

## 2014-11-06 NOTE — Telephone Encounter (Signed)
Was seen yesterday with abdominal pain, CT consistent with diverticulitis. Plan:   ciprofloxacin 500 mg twice a day #14 Flagyl 500 mg 3 times a day #21 Please call  prescriptions, made patient aware of diagnosis, recommend a bland diet, drink plenty of fluids.  call anytime if she has severe symptoms, fever, chills, constipation or if she is not gradually improving in the next 3 days. Please also made PCP aware of  diagnosis

## 2014-11-06 NOTE — Assessment & Plan Note (Signed)
lipitor Check labs

## 2014-11-06 NOTE — Assessment & Plan Note (Signed)
con't exforge Check labs bp stable

## 2014-11-06 NOTE — Telephone Encounter (Signed)
Patient has been made aware and verbalized understanding. The medication have been send and I made her aware we would call with any further study information.     KP

## 2014-11-07 ENCOUNTER — Telehealth: Payer: Self-pay

## 2014-11-07 ENCOUNTER — Ambulatory Visit (HOSPITAL_BASED_OUTPATIENT_CLINIC_OR_DEPARTMENT_OTHER)
Admission: RE | Admit: 2014-11-07 | Discharge: 2014-11-07 | Disposition: A | Discharge status: Home / Self Care | Payer: BLUE CROSS/BLUE SHIELD | Source: Ambulatory Visit | Attending: Family Medicine | Admitting: Family Medicine

## 2014-11-07 ENCOUNTER — Other Ambulatory Visit: Payer: Self-pay | Admitting: Family Medicine

## 2014-11-07 DIAGNOSIS — R222 Localized swelling, mass and lump, trunk: Secondary | ICD-10-CM

## 2014-11-07 DIAGNOSIS — R9389 Abnormal findings on diagnostic imaging of other specified body structures: Secondary | ICD-10-CM

## 2014-11-07 DIAGNOSIS — R1032 Left lower quadrant pain: Secondary | ICD-10-CM | POA: Insufficient documentation

## 2014-11-07 NOTE — Telephone Encounter (Signed)
-----   Message from Lelon Perla, DO sent at 11/06/2014  4:58 PM EDT ----- Diverticulitis treated with abx You will need a GI referral to evaluate abnormality seen in stomach We also need to get a US pelvis to evaluate endometrium --- this can all be done once your symptoms have subsided

## 2014-11-07 NOTE — Telephone Encounter (Signed)
I placed a GI referral and the patient wants to see Dr.Medoff, I can not remember if I out it in the notes.      KP

## 2014-11-07 NOTE — Telephone Encounter (Signed)
Referral has been faxed to Dr Medoff/awaiting appt

## 2014-11-08 MED ORDER — FLUCONAZOLE 150 MG PO TABS: 150.0000 mg | ORAL_TABLET | Freq: Once | ORAL | Status: DC

## 2014-11-08 NOTE — Telephone Encounter (Signed)
Message left to call the office.    KP 

## 2014-11-08 NOTE — Addendum Note (Signed)
Addended by: Arnette Norris on: 11/08/2014 05:39 PM   Modules accepted: Orders

## 2014-11-08 NOTE — Telephone Encounter (Signed)
Patient is feeling better but needs diflucan. Rx faxed.     KP

## 2014-11-08 NOTE — Telephone Encounter (Signed)
How is patient feeling?

## 2015-01-01 ENCOUNTER — Encounter: Payer: Self-pay | Admitting: Internal Medicine

## 2015-04-25 ENCOUNTER — Other Ambulatory Visit: Payer: Self-pay | Admitting: Family Medicine

## 2015-06-13 ENCOUNTER — Encounter: Payer: Self-pay | Admitting: Internal Medicine

## 2015-06-27 ENCOUNTER — Other Ambulatory Visit: Payer: Self-pay | Admitting: Family Medicine

## 2015-06-27 DIAGNOSIS — I10 Essential (primary) hypertension: Secondary | ICD-10-CM

## 2015-06-30 MED ORDER — AMLODIPINE-VALSARTAN-HCTZ 10-320-25 MG PO TABS: 1.0000 | ORAL_TABLET | Freq: Every day | ORAL | Status: DC

## 2015-06-30 NOTE — Telephone Encounter (Signed)
#  7 tablets given till 07/07/2015 appt.

## 2015-07-07 ENCOUNTER — Ambulatory Visit: Payer: Self-pay | Admitting: Family Medicine

## 2015-07-07 ENCOUNTER — Ambulatory Visit (INDEPENDENT_AMBULATORY_CARE_PROVIDER_SITE_OTHER): Payer: BLUE CROSS/BLUE SHIELD | Admitting: Family Medicine

## 2015-07-07 ENCOUNTER — Encounter: Payer: Self-pay | Admitting: Family Medicine

## 2015-07-07 VITALS — BP 132/74 | HR 84 | Temp 97.6°F | Ht 66.0 in | Wt 196.0 lb

## 2015-07-07 DIAGNOSIS — K219 Gastro-esophageal reflux disease without esophagitis: Secondary | ICD-10-CM | POA: Diagnosis not present

## 2015-07-07 DIAGNOSIS — E785 Hyperlipidemia, unspecified: Secondary | ICD-10-CM | POA: Diagnosis not present

## 2015-07-07 DIAGNOSIS — E876 Hypokalemia: Secondary | ICD-10-CM

## 2015-07-07 DIAGNOSIS — R319 Hematuria, unspecified: Secondary | ICD-10-CM

## 2015-07-07 DIAGNOSIS — I10 Essential (primary) hypertension: Secondary | ICD-10-CM

## 2015-07-07 LAB — LIPID PANEL
CHOL/HDL RATIO: 4
Cholesterol: 253 mg/dL — ABNORMAL HIGH (ref 0–200)
HDL: 62 mg/dL (ref >39.00)
LDL CALC: 179 mg/dL — AB (ref 0–99)
NONHDL: 190.91
Triglycerides: 62 mg/dL (ref 0.0–149.0)
VLDL: 12.4 mg/dL (ref 0.0–40.0)

## 2015-07-07 LAB — POCT URINALYSIS DIPSTICK
BILIRUBIN UA: NEGATIVE
Glucose, UA: NEGATIVE
KETONES UA: NEGATIVE
LEUKOCYTES UA: NEGATIVE
Nitrite, UA: NEGATIVE
PH UA: 6
Protein, UA: NEGATIVE
SPEC GRAV UA: 1.025
Urobilinogen, UA: 0.2

## 2015-07-07 LAB — COMPREHENSIVE METABOLIC PANEL
ALBUMIN: 4 g/dL (ref 3.5–5.2)
ALT: 12 U/L (ref 0–35)
AST: 15 U/L (ref 0–37)
Alkaline Phosphatase: 64 U/L (ref 39–117)
BILIRUBIN TOTAL: 0.9 mg/dL (ref 0.2–1.2)
BUN: 15 mg/dL (ref 6–23)
CALCIUM: 9.3 mg/dL (ref 8.4–10.5)
CO2: 31 mEq/L (ref 19–32)
CREATININE: 0.75 mg/dL (ref 0.40–1.20)
Chloride: 100 mEq/L (ref 96–112)
GFR: 103.03 mL/min (ref >60.00)
Glucose, Bld: 87 mg/dL (ref 70–99)
Potassium: 3.5 mEq/L (ref 3.5–5.1)
Sodium: 137 mEq/L (ref 135–145)
TOTAL PROTEIN: 6.9 g/dL (ref 6.0–8.3)

## 2015-07-07 LAB — H. PYLORI ANTIBODY, IGG: H Pylori IgG: NEGATIVE

## 2015-07-07 MED ORDER — PANTOPRAZOLE SODIUM 40 MG PO TBEC: 40.0000 mg | DELAYED_RELEASE_TABLET | Freq: Every day | ORAL | Status: DC

## 2015-07-07 MED ORDER — AMLODIPINE-VALSARTAN-HCTZ 10-320-25 MG PO TABS: 1.0000 | ORAL_TABLET | Freq: Every day | ORAL | Status: DC

## 2015-07-07 MED ORDER — ATORVASTATIN CALCIUM 40 MG PO TABS: 40.0000 mg | ORAL_TABLET | Freq: Every day | ORAL | Status: DC

## 2015-07-07 NOTE — Progress Notes (Signed)
Patient ID: Diana Randall, female    DOB: 01/28/1961  Age: 55 y.o. MRN: [id number]    Subjective:  Subjective HPI Diana AntonioJacqueline M Beltre presents for f/u cholesterol, gerd and bp.  She is having worsening gerd symptoms.    Review of Systems  Constitutional: Negative for diaphoresis, appetite change, fatigue and unexpected weight change.  Eyes: Negative for pain, redness and visual disturbance.  Respiratory: Negative for cough, chest tightness, shortness of breath and wheezing.   Cardiovascular: Negative for chest pain, palpitations and leg swelling.  Endocrine: Negative for cold intolerance, heat intolerance, polydipsia, polyphagia and polyuria.  Genitourinary: Negative for dysuria, frequency and difficulty urinating.  Neurological: Negative for dizziness, light-headedness, numbness and headaches.    History Past Medical History  Diagnosis Date  . Hypertension   . Hyperlipidemia   . GERD (gastroesophageal reflux disease)     She has no past surgical history on file.   Her family history is not on file.She reports that she has never smoked. She has never used smokeless tobacco. She reports that she drinks alcohol. She reports that she does not use illicit drugs.  No current outpatient prescriptions on file prior to visit.   No current facility-administered medications on file prior to visit.     Objective:  Objective Physical Exam  Constitutional: She is oriented to person, place, and time. She appears well-developed and well-nourished.  HENT:  Head: Normocephalic and atraumatic.  Eyes: Conjunctivae and EOM are normal.  Neck: Normal range of motion. Neck supple. No JVD present. Carotid bruit is not present. No thyromegaly present.  Cardiovascular: Normal rate, regular rhythm and normal heart sounds.   No murmur heard. Pulmonary/Chest: Effort normal and breath sounds normal. No respiratory distress. She has no wheezes. She has no rales. She exhibits no tenderness.    Musculoskeletal: She exhibits no edema.  Neurological: She is alert and oriented to person, place, and time.  Psychiatric: She has a normal mood and affect. Her behavior is normal. Judgment and thought content normal.  Nursing note and vitals reviewed.  BP 132/74 mmHg  Pulse 84  Temp(Src) 97.6 F (36.4 C) (Oral)  Ht 5\' 6"  (1.676 m)  Wt 196 lb (88.905 kg)  BMI 31.65 kg/m2  SpO2 99%  LMP 06/28/2015 Wt Readings from Last 3 Encounters:  07/07/15 196 lb (88.905 kg)  11/05/14 200 lb 12.8 oz (91.082 kg)  04/22/14 198 lb 3.2 oz (89.903 kg)     Lab Results  Component Value Date   WBC 10.6* 11/05/2014   HGB 12.9 11/05/2014   HCT 38.6 11/05/2014   PLT 294.0 11/05/2014   GLUCOSE 87 07/07/2015   CHOL 253* 07/07/2015   TRIG 62.0 07/07/2015   HDL 62.00 07/07/2015   LDLDIRECT 151.9 11/06/2012   LDLCALC 179* 07/07/2015   ALT 12 07/07/2015   AST 15 07/07/2015   NA 137 07/07/2015   K 3.5 07/07/2015   CL 100 07/07/2015   CREATININE 0.75 07/07/2015   BUN 15 07/07/2015   CO2 31 07/07/2015   TSH 1.35 04/22/2014   MICROALBUR 1.2 11/06/2012    [place] Transvaginal Non-ob  11/08/2014  CLINICAL DATA:  Left lower quadrant pain for 3 days. Abnormal appearing endometrial thickness seen on recent CT. LMP 10/23/2014. EXAM: TRANSABDOMINAL AND TRANSVAGINAL ULTRASOUND OF PELVIS TECHNIQUE: Both transabdominal and transvaginal ultrasound examinations of the pelvis were performed. Transabdominal technique was performed for global imaging of the pelvis including uterus, ovaries, adnexal regions, and pelvic cul-de-sac. It was necessary to proceed with endovaginal exam  following the transabdominal exam to visualize the endometrial stripe and ovaries. COMPARISON:  CT on 11/05/2014 FINDINGS: Uterus Measurements: 8.5 x 4.8 x 5.4 cm. Diffusely heterogeneous echotexture uterine myometrium noted, but no distinct fibroids visualized. Endometrium Thickness: 13 mm.  No focal abnormality visualized. Right ovary  Measurements: 2.7 x 1.2 x 0.9 cm. Normal appearance/no adnexal mass. Left ovary Measurements: 3.6 x 2.2 x 3.1 cm. Small 2 cm involuting corpus luteum cyst noted. Otherwise normal appearance/no adnexal mass. Other findings No free fluid. IMPRESSION: Endometrial thickness measures 13 mm, which is considered within normal limits for a premenopausal female. No definite fibroids or adnexal mass identified. Electronically Signed   By: Myles Rosenthal M.D.   On: 11/08/2014 08:52   US Pelvis Complete  11/08/2014  CLINICAL DATA:  Left lower quadrant pain for 3 days. Abnormal appearing endometrial thickness seen on recent CT. LMP 10/23/2014. EXAM: TRANSABDOMINAL AND TRANSVAGINAL ULTRASOUND OF PELVIS TECHNIQUE: Both transabdominal and transvaginal ultrasound examinations of the pelvis were performed. Transabdominal technique was performed for global imaging of the pelvis including uterus, ovaries, adnexal regions, and pelvic cul-de-sac. It was necessary to proceed with endovaginal exam following the transabdominal exam to visualize the endometrial stripe and ovaries. COMPARISON:  CT on 11/05/2014 FINDINGS: Uterus Measurements: 8.5 x 4.8 x 5.4 cm. Diffusely heterogeneous echotexture uterine myometrium noted, but no distinct fibroids visualized. Endometrium Thickness: 13 mm.  No focal abnormality visualized. Right ovary Measurements: 2.7 x 1.2 x 0.9 cm. Normal appearance/no adnexal mass. Left ovary Measurements: 3.6 x 2.2 x 3.1 cm. Small 2 cm involuting corpus luteum cyst noted. Otherwise normal appearance/no adnexal mass. Other findings No free fluid. IMPRESSION: Endometrial thickness measures 13 mm, which is considered within normal limits for a premenopausal female. No definite fibroids or adnexal mass identified. Electronically Signed   By: Myles Rosenthal M.D.   On: 11/08/2014 08:52     Assessment & Plan:  Plan I have discontinued Ms. Pidgeon's ciprofloxacin, metroNIDAZOLE, fluconazole, and omeprazole. I am also having her  start on pantoprazole. Additionally, I am having her maintain her Amlodipine-Valsartan-HCTZ, atorvastatin, and potassium chloride.  Meds ordered this encounter  Medications  . Amlodipine-Valsartan-HCTZ (EXFORGE HCT) 10-320-25 MG TABS    Sig: Take 1 tablet by mouth daily. 1 po qd    Dispense:  90 tablet    Refill:  1  . atorvastatin (LIPITOR) 40 MG tablet    Sig: Take 1 tablet (40 mg total) by mouth daily.    Dispense:  90 tablet    Refill:  1  . pantoprazole (PROTONIX) 40 MG tablet    Sig: Take 1 tablet (40 mg total) by mouth daily.    Dispense:  30 tablet    Refill:  3  . potassium chloride (K-DUR) 10 MEQ tablet    Sig: 2 po qd    Dispense:  60 tablet    Refill:  2    Problem List Items Addressed This Visit    HTN (hypertension) - Primary   Relevant Medications   Amlodipine-Valsartan-HCTZ (EXFORGE HCT) 10-320-25 MG TABS   atorvastatin (LIPITOR) 40 MG tablet   Other Relevant Orders   Comprehensive metabolic panel (Completed)   Lipid panel (Completed)   POCT urinalysis dipstick (Completed)   Comprehensive metabolic panel   Hyperlipidemia   Relevant Medications   Amlodipine-Valsartan-HCTZ (EXFORGE HCT) 10-320-25 MG TABS   atorvastatin (LIPITOR) 40 MG tablet   Other Relevant Orders   Comprehensive metabolic panel (Completed)   Lipid panel (Completed)   POCT urinalysis dipstick (Completed)  Comprehensive metabolic panel    Other Visit Diagnoses    Gastroesophageal reflux disease, esophagitis presence not specified        Relevant Medications    pantoprazole (PROTONIX) 40 MG tablet    Other Relevant Orders    H. pylori antibody, IgG (Completed)    Hematuria        Relevant Orders    Urine culture (Completed)    Hypokalemia        Relevant Medications    potassium chloride (K-DUR) 10 MEQ tablet       Follow-up: Return in about 6 months (around 01/07/2016), or if symptoms worsen or fail to improve, for hypertension, hyperlipidemia.  [name], DO

## 2015-07-07 NOTE — Patient Instructions (Signed)

## 2015-07-07 NOTE — Progress Notes (Signed)
Pre visit review using our clinic review tool, if applicable. No additional management support is needed unless otherwise documented below in the visit note. 

## 2015-07-08 ENCOUNTER — Telehealth: Payer: Self-pay

## 2015-07-08 MED ORDER — POTASSIUM CHLORIDE ER 10 MEQ PO TBCR: EXTENDED_RELEASE_TABLET | ORAL | Status: DC

## 2015-07-08 NOTE — Telephone Encounter (Signed)
Called Dr. Jennye BoroughsMedoff's office at [phone] and spoke with Meriam SpragueBeverly to request records.  She said that she was going to fax them over now.    Awaiting reports.

## 2015-07-08 NOTE — Telephone Encounter (Signed)
-----   Message from Donato SchultzYvonne R Lowne Chase, DO sent at 07/07/2015 10:56 AM EDT ----- i need her egd from Dr Kinnie ScalesMedoff

## 2015-07-09 LAB — URINE CULTURE
Colony Count: NO GROWTH
ORGANISM ID, BACTERIA: NO GROWTH

## 2015-07-16 LAB — HM MAMMOGRAPHY

## 2015-07-23 ENCOUNTER — Encounter: Payer: Self-pay | Admitting: Family Medicine

## 2015-07-30 ENCOUNTER — Encounter: Payer: Self-pay | Admitting: Family Medicine

## 2015-08-11 NOTE — Telephone Encounter (Signed)
Received

## 2015-09-03 DIAGNOSIS — Z01419 Encounter for gynecological examination (general) (routine) without abnormal findings: Secondary | ICD-10-CM | POA: Diagnosis not present

## 2015-09-29 ENCOUNTER — Encounter: Payer: Self-pay | Admitting: Family Medicine

## 2016-01-13 ENCOUNTER — Ambulatory Visit: Payer: BLUE CROSS/BLUE SHIELD | Admitting: Family Medicine

## 2016-01-22 DIAGNOSIS — M170 Bilateral primary osteoarthritis of knee: Secondary | ICD-10-CM | POA: Diagnosis not present

## 2016-01-27 ENCOUNTER — Ambulatory Visit (INDEPENDENT_AMBULATORY_CARE_PROVIDER_SITE_OTHER): Payer: BLUE CROSS/BLUE SHIELD | Admitting: Family Medicine

## 2016-01-27 ENCOUNTER — Encounter: Payer: Self-pay | Admitting: Family Medicine

## 2016-01-27 VITALS — BP 130/86 | HR 85 | Temp 98.2°F | Resp 16 | Ht 66.0 in | Wt 199.6 lb

## 2016-01-27 DIAGNOSIS — Z1159 Encounter for screening for other viral diseases: Secondary | ICD-10-CM

## 2016-01-27 DIAGNOSIS — I10 Essential (primary) hypertension: Secondary | ICD-10-CM

## 2016-01-27 DIAGNOSIS — G4700 Insomnia, unspecified: Secondary | ICD-10-CM

## 2016-01-27 DIAGNOSIS — E785 Hyperlipidemia, unspecified: Secondary | ICD-10-CM | POA: Diagnosis not present

## 2016-01-27 MED ORDER — AMLODIPINE-VALSARTAN-HCTZ 10-320-25 MG PO TABS: 1.0000 | ORAL_TABLET | Freq: Every day | ORAL | 1 refills | Status: DC

## 2016-01-27 MED ORDER — ATORVASTATIN CALCIUM 40 MG PO TABS: 40.0000 mg | ORAL_TABLET | Freq: Every day | ORAL | 1 refills | Status: DC

## 2016-01-27 MED ORDER — ALPRAZOLAM 0.25 MG PO TABS: ORAL_TABLET | ORAL | 0 refills | Status: DC

## 2016-01-27 NOTE — Assessment & Plan Note (Addendum)
Slightly elevated today but pt states its better at home ---- she is under a lot of stress trying to find respite care for [place]

## 2016-01-27 NOTE — Patient Instructions (Signed)

## 2016-01-27 NOTE — Progress Notes (Signed)
Patient ID: Diana AntonioJacqueline M Carrington, female    DOB: 11/29/1960  Age: 55 y.o. MRN: [id number]    Subjective:  Subjective  HPI Diana Randall presents for f/u htn and cholesterol.  No complaints.   Review of Systems  Constitutional: Negative for activity change, appetite change, diaphoresis, fatigue and unexpected weight change.  Eyes: Negative for pain, redness and visual disturbance.  Respiratory: Negative for cough, chest tightness, shortness of breath and wheezing.   Cardiovascular: Negative for chest pain, palpitations and leg swelling.  Endocrine: Negative for cold intolerance, heat intolerance, polydipsia, polyphagia and polyuria.  Genitourinary: Negative for difficulty urinating, dysuria and frequency.  Neurological: Negative for dizziness, light-headedness, numbness and headaches.  Psychiatric/Behavioral: Negative for behavioral problems and dysphoric mood. The patient is not nervous/anxious.     History Past Medical History:  Diagnosis Date  . GERD (gastroesophageal reflux disease)   . Hyperlipidemia   . Hypertension     She has no past surgical history on file.   Her family history is not on file.She reports that she has never smoked. She has never used smokeless tobacco. She reports that she drinks alcohol. She reports that she does not use drugs.  Current Outpatient Prescriptions on File Prior to Visit  Medication Sig Dispense Refill  . pantoprazole (PROTONIX) 40 MG tablet Take 1 tablet (40 mg total) by mouth daily. 30 tablet 3  . potassium chloride (K-DUR) 10 MEQ tablet 2 po qd 60 tablet 2   No current facility-administered medications on file prior to visit.      Objective:  Objective  Physical Exam  Constitutional: She is oriented to person, place, and time. She appears well-developed and well-nourished.  HENT:  Head: Normocephalic and atraumatic.  Eyes: Conjunctivae and EOM are normal.  Neck: Normal range of motion. Neck supple. No JVD present. Carotid bruit is  not present. No thyromegaly present.  Cardiovascular: Normal rate, regular rhythm and normal heart sounds.   No murmur heard. Pulmonary/Chest: Effort normal and breath sounds normal. No respiratory distress. She has no wheezes. She has no rales. She exhibits no tenderness.  Musculoskeletal: She exhibits no edema.  Neurological: She is alert and oriented to person, place, and time.  Psychiatric: She has a normal mood and affect. Her behavior is normal. Judgment and thought content normal.  Nursing note and vitals reviewed.  BP 130/86 (BP Location: Right Arm, Patient Position: Sitting, Cuff Size: Normal)   Pulse 85   Temp 98.2 F (36.8 C) (Oral)   Resp 16   Ht 5\' 6"  (1.676 m)   Wt 199 lb 9.6 oz (90.5 kg)   SpO2 97%   BMI 32.22 kg/m  Wt Readings from Last 3 Encounters:  01/27/16 199 lb 9.6 oz (90.5 kg)  07/07/15 196 lb (88.9 kg)  11/05/14 200 lb 12.8 oz (91.1 kg)     Lab Results  Component Value Date   WBC 10.6 (H) 11/05/2014   HGB 12.9 11/05/2014   HCT 38.6 11/05/2014   PLT 294.0 11/05/2014   GLUCOSE 87 07/07/2015   CHOL 253 (H) 07/07/2015   TRIG 62.0 07/07/2015   HDL 62.00 07/07/2015   LDLDIRECT 151.9 11/06/2012   LDLCALC 179 (H) 07/07/2015   ALT 12 07/07/2015   AST 15 07/07/2015   NA 137 07/07/2015   K 3.5 07/07/2015   CL 100 07/07/2015   CREATININE 0.75 07/07/2015   BUN 15 07/07/2015   CO2 31 07/07/2015   TSH 1.35 04/22/2014   MICROALBUR 1.2 11/06/2012  US Transvaginal Non-ob  Result Date: 11/08/2014 CLINICAL DATA:  Left lower quadrant pain for 3 days. Abnormal appearing endometrial thickness seen on recent CT. LMP 10/23/2014. EXAM: TRANSABDOMINAL AND TRANSVAGINAL ULTRASOUND OF PELVIS TECHNIQUE: Both transabdominal and transvaginal ultrasound examinations of the pelvis were performed. Transabdominal technique was performed for global imaging of the pelvis including uterus, ovaries, adnexal regions, and pelvic cul-de-sac. It was necessary to proceed with  endovaginal exam following the transabdominal exam to visualize the endometrial stripe and ovaries. COMPARISON:  CT on 11/05/2014 FINDINGS: Uterus Measurements: 8.5 x 4.8 x 5.4 cm. Diffusely heterogeneous echotexture uterine myometrium noted, but no distinct fibroids visualized. Endometrium Thickness: 13 mm.  No focal abnormality visualized. Right ovary Measurements: 2.7 x 1.2 x 0.9 cm. Normal appearance/no adnexal mass. Left ovary Measurements: 3.6 x 2.2 x 3.1 cm. Small 2 cm involuting corpus luteum cyst noted. Otherwise normal appearance/no adnexal mass. Other findings No free fluid. IMPRESSION: Endometrial thickness measures 13 mm, which is considered within normal limits for a premenopausal female. No definite fibroids or adnexal mass identified. Electronically Signed   By: Myles Rosenthal M.D.   On: 11/08/2014 08:52   US Pelvis Complete  Result Date: 11/08/2014 CLINICAL DATA:  Left lower quadrant pain for 3 days. Abnormal appearing endometrial thickness seen on recent CT. LMP 10/23/2014. EXAM: TRANSABDOMINAL AND TRANSVAGINAL ULTRASOUND OF PELVIS TECHNIQUE: Both transabdominal and transvaginal ultrasound examinations of the pelvis were performed. Transabdominal technique was performed for global imaging of the pelvis including uterus, ovaries, adnexal regions, and pelvic cul-de-sac. It was necessary to proceed with endovaginal exam following the transabdominal exam to visualize the endometrial stripe and ovaries. COMPARISON:  CT on 11/05/2014 FINDINGS: Uterus Measurements: 8.5 x 4.8 x 5.4 cm. Diffusely heterogeneous echotexture uterine myometrium noted, but no distinct fibroids visualized. Endometrium Thickness: 13 mm.  No focal abnormality visualized. Right ovary Measurements: 2.7 x 1.2 x 0.9 cm. Normal appearance/no adnexal mass. Left ovary Measurements: 3.6 x 2.2 x 3.1 cm. Small 2 cm involuting corpus luteum cyst noted. Otherwise normal appearance/no adnexal mass. Other findings No free fluid. IMPRESSION:  Endometrial thickness measures 13 mm, which is considered within normal limits for a premenopausal female. No definite fibroids or adnexal mass identified. Electronically Signed   By: Myles Rosenthal M.D.   On: 11/08/2014 08:52     Assessment & Plan:  Plan  I am having Diana Randall start on ALPRAZolam. I am also having her maintain her pantoprazole, potassium chloride, Amlodipine-Valsartan-HCTZ, and atorvastatin.  Meds ordered this encounter  Medications  . ALPRAZolam (XANAX) 0.25 MG tablet    Sig: 1 po qd for flying    Dispense:  20 tablet    Refill:  0  . Amlodipine-Valsartan-HCTZ (EXFORGE HCT) 10-320-25 MG TABS    Sig: Take 1 tablet by mouth daily. 1 po qd    Dispense:  90 tablet    Refill:  1  . atorvastatin (LIPITOR) 40 MG tablet    Sig: Take 1 tablet (40 mg total) by mouth daily.    Dispense:  90 tablet    Refill:  1    Problem List Items Addressed This Visit      Unprioritized   Hyperlipidemia   Relevant Medications   Amlodipine-Valsartan-HCTZ (EXFORGE HCT) 10-320-25 MG TABS   atorvastatin (LIPITOR) 40 MG tablet   Essential hypertension    Slightly elevated today but pt states its better at home ---- she is under a lot of stress trying to find respite care for her dad so they can go  to [place]        Relevant Medications   Amlodipine-Valsartan-HCTZ (EXFORGE HCT) 10-320-25 MG TABS   atorvastatin (LIPITOR) 40 MG tablet   Other Relevant Orders   Comprehensive metabolic panel    Other Visit Diagnoses    Insomnia, unspecified type    -  Primary   Relevant Medications   ALPRAZolam (XANAX) 0.25 MG tablet   Hyperlipidemia LDL goal <100       Relevant Medications   Amlodipine-Valsartan-HCTZ (EXFORGE HCT) 10-320-25 MG TABS   atorvastatin (LIPITOR) 40 MG tablet   Other Relevant Orders   Comprehensive metabolic panel   Lipid panel   Need for hepatitis C screening test       Relevant Orders   Hepatitis C antibody      Follow-up: Return in about 6 months (around 07/27/2016)  for annual exam, fasting.  Donato SchultzYvonne R Lowne Chase, DO

## 2016-03-02 DIAGNOSIS — J069 Acute upper respiratory infection, unspecified: Secondary | ICD-10-CM | POA: Diagnosis not present

## 2016-03-15 ENCOUNTER — Encounter: Payer: Self-pay | Admitting: Family Medicine

## 2016-03-15 NOTE — Telephone Encounter (Signed)
It would be a good Idea for her to make an appointment She can try mucinex or mucinex dm to break up mucus.   Flonase, nasacort or rhinocort aq otc-- sinus congestion And try zyrtec, claritin, allegra or xyzal to dry up drainage

## 2016-03-18 ENCOUNTER — Encounter: Payer: Self-pay | Admitting: Internal Medicine

## 2016-03-18 ENCOUNTER — Ambulatory Visit (INDEPENDENT_AMBULATORY_CARE_PROVIDER_SITE_OTHER): Payer: BLUE CROSS/BLUE SHIELD | Admitting: Internal Medicine

## 2016-03-18 VITALS — BP 126/78 | HR 77 | Temp 98.2°F | Resp 14 | Ht 66.0 in | Wt 196.4 lb

## 2016-03-18 DIAGNOSIS — J019 Acute sinusitis, unspecified: Secondary | ICD-10-CM | POA: Diagnosis not present

## 2016-03-18 MED ORDER — AZITHROMYCIN 250 MG PO TABS: ORAL_TABLET | ORAL | 0 refills | Status: DC

## 2016-03-18 MED ORDER — HYDROCODONE-HOMATROPINE 5-1.5 MG/5ML PO SYRP: 5.0000 mL | ORAL_SOLUTION | Freq: Every evening | ORAL | 0 refills | Status: DC | PRN

## 2016-03-18 MED ORDER — AZELASTINE HCL 0.1 % NA SOLN: 2.0000 | Freq: Every evening | NASAL | 3 refills | Status: DC | PRN

## 2016-03-18 NOTE — Progress Notes (Signed)
Pre visit review using our clinic review tool, if applicable. No additional management support is needed unless otherwise documented below in the visit note. 

## 2016-03-18 NOTE — Progress Notes (Signed)
Subjective:    Patient ID: Diana Randall, female    DOB: 12-04-1960, 56 y.o.   MRN: [id number]  DOS:  03/18/2016 Type of visit - description : Acute visit Interval history: Symptoms started 02/19/2016---- congestion, sinus pressure, postnasal dripping. Symptoms were not getting better, went to urgent care 03/02/2016, flu test negative, DX URI, was recommended OTCs. Since then, she continue with the same symptoms and in addition the cough is getting worse, last night was severe, she couldn't sleep, she also noted some wheezing. The postnasal dripping is abundant.  Also concerned about a PE, somebody she knows almost died from one. She flew to [place] in December, returned 02-05-16.  Review of Systems She denies chest pain, difficulty breathing or palpitations particularly when she came back from [place]. At the time, she had no leg swelling or calf pain.   Past Medical History:  Diagnosis Date  . GERD (gastroesophageal reflux disease)   . Hyperlipidemia   . Hypertension     History reviewed. No pertinent surgical history.  Social History   Social History  . Marital status: Single    Spouse name: N/A  . Number of children: N/A  . Years of education: N/A   Occupational History  . Not on file.   Social History Main Topics  . Smoking status: Never Smoker  . Smokeless tobacco: Never Used  . Alcohol use Yes  . Drug use: No  . Sexual activity: Not Currently    Partners: Male   Other Topics Concern  . Not on file   Social History Narrative  . No narrative on file      Allergies as of 03/18/2016      Reactions   Latex Rash      Medication List       Accurate as of 03/18/16  6:05 PM. Always use your most recent med list.          ALPRAZolam 0.25 MG tablet Commonly known as:  XANAX 1 po qd for flying   Amlodipine-Valsartan-HCTZ 10-320-25 MG Tabs Commonly known as:  EXFORGE HCT Take 1 tablet by mouth daily. 1 po qd   atorvastatin 40 MG tablet Commonly known  as:  LIPITOR Take 1 tablet (40 mg total) by mouth daily.   azelastine 0.1 % nasal spray Commonly known as:  ASTELIN Place 2 sprays into both nostrils at bedtime as needed for rhinitis. Use in each nostril as directed   azithromycin 250 MG tablet Commonly known as:  ZITHROMAX Z-PAK 2 tabs a day the first day, then 1 tab a day x 4 days   HYDROcodone-homatropine 5-1.5 MG/5ML syrup Commonly known as:  HYCODAN Take 5 mLs by mouth at bedtime as needed for cough.   pantoprazole 40 MG tablet Commonly known as:  PROTONIX Take 1 tablet (40 mg total) by mouth daily.   potassium chloride 10 MEQ tablet Commonly known as:  K-DUR 2 po qd          Objective:   Physical Exam BP 126/78 (BP Location: Left Arm, Patient Position: Sitting, Cuff Size: Normal)   Pulse 77   Temp 98.2 F (36.8 C) (Oral)   Resp 14   Ht 5\' 6"  (1.676 m)   Wt 196 lb 6 oz (89.1 kg)   SpO2 96%   BMI 31.70 kg/m  General:   Well developed, well nourished . NAD.  HEENT:  Normocephalic . Face symmetric, atraumatic. Nose congested, sinuses no TTP. Throat symmetric, slightly red, noted PND, it is  light brown in color. TMs: Slightly bulge, not red Lungs:  CTA B Normal respiratory effort, no intercostal retractions, no accessory muscle use. Heart: RRR,  no murmur.  No pretibial edema bilaterally . Calves symmetric and no TTP. Skin: Not pale. Not jaundice Neurologic:  alert & oriented X3.  Speech normal, gait appropriate for age and unassisted Psych--  Cognition and judgment appear intact.  Cooperative with normal attention span and concentration.  Behavior appropriate. No anxious or depressed appearing.      Assessment & Plan:    56 year old lady with history of HTN, hyperlipidemia, GERD Presents with:  Sinusitis: Symptoms likely due to sinusitis, will treat with a Z-Pak, Astelin, Flonase, hydrocodone at nighttime if needed. Patient concerned about a PE due to recent airplane trip, which ended about 5  weeks ago, she does not have anything to suggest a PE thus pretest possibility is very low and no test is indicated at this time.

## 2016-03-18 NOTE — Patient Instructions (Signed)
Rest, fluids , tylenol  For cough:  Take Mucinex DM twice a day as needed until better If the cough continue-- hydrocodone at night, will cause drowsiness   For nasal congestion: Use OTC Nasocort or Flonase : 2 nasal sprays on each side of the nose in the morning until you feel better Use ASTELIN a prescribed spray : 2 nasal sprays on each side of the nose at night until you feel better   Avoid decongestants such as  Pseudoephedrine or phenylephrine   Take the antibiotic as prescribed  (zithromax)  ll if not gradually better over the next  10 days  Call anytime if the symptoms are severe

## 2016-06-18 ENCOUNTER — Ambulatory Visit (INDEPENDENT_AMBULATORY_CARE_PROVIDER_SITE_OTHER): Payer: BLUE CROSS/BLUE SHIELD | Admitting: Family Medicine

## 2016-06-18 ENCOUNTER — Telehealth: Payer: Self-pay | Admitting: Family Medicine

## 2016-06-18 ENCOUNTER — Encounter: Payer: Self-pay | Admitting: Family Medicine

## 2016-06-18 VITALS — BP 119/66 | HR 92 | Temp 97.5°F | Resp 16 | Ht 66.3 in | Wt 201.2 lb

## 2016-06-18 DIAGNOSIS — J069 Acute upper respiratory infection, unspecified: Secondary | ICD-10-CM | POA: Diagnosis not present

## 2016-06-18 DIAGNOSIS — R05 Cough: Secondary | ICD-10-CM

## 2016-06-18 DIAGNOSIS — R059 Cough, unspecified: Secondary | ICD-10-CM

## 2016-06-18 LAB — POC INFLUENZA A&B (BINAX/QUICKVUE)
INFLUENZA A, POC: NEGATIVE
Influenza B, POC: NEGATIVE

## 2016-06-18 MED ORDER — PROMETHAZINE-DM 6.25-15 MG/5ML PO SYRP: 5.0000 mL | ORAL_SOLUTION | Freq: Four times a day (QID) | ORAL | 0 refills | Status: DC | PRN

## 2016-06-18 MED ORDER — LEVOCETIRIZINE DIHYDROCHLORIDE 5 MG PO TABS: 5.0000 mg | ORAL_TABLET | Freq: Every evening | ORAL | 5 refills | Status: DC

## 2016-06-18 MED ORDER — FLUTICASONE PROPIONATE 50 MCG/ACT NA SUSP: 2.0000 | Freq: Every day | NASAL | 6 refills | Status: DC

## 2016-06-18 NOTE — Telephone Encounter (Signed)
Left message that rx was sent to the correct pharmacy

## 2016-06-18 NOTE — Telephone Encounter (Signed)
Caller name: Adela LankJacqueline  Relation to pt: Self Call back number: [phone] [facility] [address]  Reason for call: Pt came in office today and was seen by provider 06-18-2016, pt notice on her AVS that her prescriptions was sent to [facility] [address] pt states she has never use this pharmacy and that [facility] pharmacy of preference [facility] on [address] [place]. Pt would [facility], please have [facility] and [facility] all her meds that were prescribe to her today, let pt know when done so she can pick up rx. Please advise.

## 2016-06-18 NOTE — Patient Instructions (Signed)
Upper Respiratory Infection, Adult Most upper respiratory infections (URIs) are a viral infection of the air passages leading to the lungs. A URI affects the nose, throat, and upper air passages. The most common type of URI is nasopharyngitis and is typically referred to as "the common cold." URIs run their course and usually go away on their own. Most of the time, a URI does not require medical attention, but sometimes a bacterial infection in the upper airways can follow a viral infection. This is called a secondary infection. Sinus and middle ear infections are common types of secondary upper respiratory infections. Bacterial pneumonia can also complicate a URI. A URI can worsen asthma and chronic obstructive pulmonary disease (COPD). Sometimes, these complications can require emergency medical care and may be life threatening. What are the causes? Almost all URIs are caused by viruses. A virus is a type of germ and can spread from one person to another. What increases the risk? You may be at risk for a URI if:  You smoke.  You have chronic heart or lung disease.  You have a weakened defense (immune) system.  You are very young or very old.  You have nasal allergies or asthma.  You work in crowded or poorly ventilated areas.  You work in health care facilities or schools.  What are the signs or symptoms? Symptoms typically develop 2-3 days after you come in contact with a cold virus. Most viral URIs last 7-10 days. However, viral URIs from the influenza virus (flu virus) can last 14-18 days and are typically more severe. Symptoms may include:  Runny or stuffy (congested) nose.  Sneezing.  Cough.  Sore throat.  Headache.  Fatigue.  Fever.  Loss of appetite.  Pain in your forehead, behind your eyes, and over your cheekbones (sinus pain).  Muscle aches.  How is this diagnosed? Your health care provider may diagnose a URI by:  Physical exam.  Tests to check that your  symptoms are not due to another condition such as: ? Strep throat. ? Sinusitis. ? Pneumonia. ? Asthma.  How is this treated? A URI goes away on its own with time. It cannot be cured with medicines, but medicines may be prescribed or recommended to relieve symptoms. Medicines may help:  Reduce your fever.  Reduce your cough.  Relieve nasal congestion.  Follow these instructions at home:  Take medicines only as directed by your health care provider.  Gargle warm saltwater or take cough drops to comfort your throat as directed by your health care provider.  Use a warm mist humidifier or inhale steam from a shower to increase air moisture. This may make it easier to breathe.  Drink enough fluid to keep your urine clear or pale yellow.  Eat soups and other clear broths and maintain good nutrition.  Rest as needed.  Return to work when your temperature has returned to normal or as your health care provider advises. You may need to stay home longer to avoid infecting others. You can also use a face mask and careful hand washing to prevent spread of the virus.  Increase the usage of your inhaler if you have asthma.  Do not use any tobacco products, including cigarettes, chewing tobacco, or electronic cigarettes. If you need help quitting, ask your health care provider. How is this prevented? The best way to protect yourself from getting a cold is to practice good hygiene.  Avoid oral or hand contact with people with cold symptoms.  Wash your   hands often if contact occurs.  There is no clear evidence that vitamin C, vitamin E, echinacea, or exercise reduces the chance of developing a cold. However, it is always recommended to get plenty of rest, exercise, and practice good nutrition. Contact a health care provider if:  You are getting worse rather than better.  Your symptoms are not controlled by medicine.  You have chills.  You have worsening shortness of breath.  You have  brown or red mucus.  You have yellow or brown nasal discharge.  You have pain in your face, especially when you bend forward.  You have a fever.  You have swollen neck glands.  You have pain while swallowing.  You have white areas in the back of your throat. Get help right away if:  You have severe or persistent: ? Headache. ? Ear pain. ? Sinus pain. ? Chest pain.  You have chronic lung disease and any of the following: ? Wheezing. ? Prolonged cough. ? Coughing up blood. ? A change in your usual mucus.  You have a stiff neck.  You have changes in your: ? Vision. ? Hearing. ? Thinking. ? Mood. This information is not intended to replace advice given to you by your health care provider. Make sure you discuss any questions you have with your health care provider. Document Released: 07/28/2000 Document Revised: 10/05/2015 Document Reviewed: 05/09/2013 Elsevier Interactive Patient Education  2017 [employer]  

## 2016-06-18 NOTE — Progress Notes (Signed)
Pre visit review using our clinic review tool, if applicable. No additional management support is needed unless otherwise documented below in the visit note. 

## 2016-06-18 NOTE — Progress Notes (Signed)
Patient ID: Diana Randall, female   DOB: Jul 07, 1960, 56 y.o.   MRN: [id number]     Subjective:    Patient ID: Diana Randall, female    DOB: [birth year], 56 y.o.   MRN: [id number]  Chief Complaint  Patient presents with  . Sinus Problem    Sinus Problem  This is a new problem. The current episode started yesterday. Maximum temperature: low grade 99. Associated symptoms include chills, congestion, coughing, headaches and sinus pressure. Pertinent negatives include no shortness of breath. (Scratchy throat) Treatments tried: ibuprofen. The treatment provided mild relief.  one of her co workers was diagnosed with the flu earlier this week. Pt had fever of 100 earlier this week. Patient is in today for possible sinus infection.  Past Medical History:  Diagnosis Date  . GERD (gastroesophageal reflux disease)   . Hyperlipidemia   . Hypertension     No past surgical history on file.  No family history on file.  Social History   Social History  . Marital status: Single    Spouse name: N/A  . Number of children: N/A  . Years of education: N/A   Occupational History  . Not on file.   Social History Main Topics  . Smoking status: Never Smoker  . Smokeless tobacco: Never Used  . Alcohol use Yes  . Drug use: No  . Sexual activity: Not Currently    Partners: Male   Other Topics Concern  . Not on file   Social History Narrative  . No narrative on file    Outpatient Medications Prior to Visit  Medication Sig Dispense Refill  . Amlodipine-Valsartan-HCTZ (EXFORGE HCT) 10-320-25 MG TABS Take 1 tablet by mouth daily. 1 po qd 90 tablet 1  . atorvastatin (LIPITOR) 40 MG tablet Take 1 tablet (40 mg total) by mouth daily. 90 tablet 1  . azelastine (ASTELIN) 0.1 % nasal spray Place 2 sprays into both nostrils at bedtime as needed for rhinitis. Use in each nostril as directed 30 mL 3  . potassium chloride (K-DUR) 10 MEQ tablet 2 po qd 60 tablet 2  . ALPRAZolam (XANAX) 0.25 MG  tablet 1 po qd for flying 20 tablet 0  . azithromycin (ZITHROMAX Z-PAK) 250 MG tablet 2 tabs a day the first day, then 1 tab a day x 4 days 6 tablet 0  . HYDROcodone-homatropine (HYCODAN) 5-1.5 MG/5ML syrup Take 5 mLs by mouth at bedtime as needed for cough. 120 mL 0  . pantoprazole (PROTONIX) 40 MG tablet Take 1 tablet (40 mg total) by mouth daily. 30 tablet 3   No facility-administered medications prior to visit.     Allergies  Allergen Reactions  . Latex Rash    Review of Systems  Constitutional: Positive for chills. Negative for fever and malaise/fatigue.  HENT: Positive for congestion and sinus pressure.   Eyes: Negative for blurred vision.  Respiratory: Positive for cough and wheezing. Negative for shortness of breath.        Dry cough  Cardiovascular: Negative for chest pain, palpitations and leg swelling.  Gastrointestinal: Negative for vomiting.  Musculoskeletal: Negative for back pain.  Skin: Negative for rash.  Neurological: Positive for headaches. Negative for loss of consciousness.       Objective:    Physical Exam  Constitutional: She is oriented to person, place, and time. She appears well-developed and well-nourished. No distress.  HENT:  Head: Normocephalic and atraumatic.  Right Ear: External ear normal.  Left Ear: External ear normal.  Nose:    Mouth/Throat: Oropharynx is clear and moist.  Eyes: Conjunctivae are normal.  Neck: Normal range of motion. No thyromegaly present.  Cardiovascular: Normal rate and regular rhythm.   Pulmonary/Chest: Effort normal and breath sounds normal. She has no wheezes.  Abdominal: Soft. Bowel sounds are normal. There is no tenderness.  Musculoskeletal: Normal range of motion. She exhibits no edema or deformity.  Neurological: She is alert and oriented to person, place, and time.  Skin: Skin is warm and dry. She is not diaphoretic.  Psychiatric: She has a normal mood and affect.  Nursing note and vitals reviewed.   BP  119/66 (BP Location: Left Arm, Cuff Size: Large)   Pulse 92   Temp 97.5 F (36.4 C) (Oral)   Resp 16   Ht 5' 6.3" (1.684 m)   Wt 201 lb 3.2 oz (91.3 kg)   LMP 06/12/2016   SpO2 100%   BMI 32.18 kg/m  Wt Readings from Last 3 Encounters:  06/18/16 201 lb 3.2 oz (91.3 kg)  03/18/16 196 lb 6 oz (89.1 kg)  01/27/16 199 lb 9.6 oz (90.5 kg)     Lab Results  Component Value Date   WBC 10.6 (H) 11/05/2014   HGB 12.9 11/05/2014   HCT 38.6 11/05/2014   PLT 294.0 11/05/2014   GLUCOSE 87 07/07/2015   CHOL 253 (H) 07/07/2015   TRIG 62.0 07/07/2015   HDL 62.00 07/07/2015   LDLDIRECT 151.9 11/06/2012   LDLCALC 179 (H) 07/07/2015   ALT 12 07/07/2015   AST 15 07/07/2015   NA 137 07/07/2015   K 3.5 07/07/2015   CL 100 07/07/2015   CREATININE 0.75 07/07/2015   BUN 15 07/07/2015   CO2 31 07/07/2015   TSH 1.35 04/22/2014   MICROALBUR 1.2 11/06/2012    Lab Results  Component Value Date   TSH 1.35 04/22/2014   Lab Results  Component Value Date   WBC 10.6 (H) 11/05/2014   HGB 12.9 11/05/2014   HCT 38.6 11/05/2014   MCV 90.9 11/05/2014   PLT 294.0 11/05/2014   Lab Results  Component Value Date   NA 137 07/07/2015   K 3.5 07/07/2015   CO2 31 07/07/2015   GLUCOSE 87 07/07/2015   BUN 15 07/07/2015   CREATININE 0.75 07/07/2015   BILITOT 0.9 07/07/2015   ALKPHOS 64 07/07/2015   AST 15 07/07/2015   ALT 12 07/07/2015   PROT 6.9 07/07/2015   ALBUMIN 4.0 07/07/2015   CALCIUM 9.3 07/07/2015   GFR 103.03 07/07/2015   Lab Results  Component Value Date   CHOL 253 (H) 07/07/2015   Lab Results  Component Value Date   HDL 62.00 07/07/2015   Lab Results  Component Value Date   LDLCALC 179 (H) 07/07/2015   Lab Results  Component Value Date   TRIG 62.0 07/07/2015   Lab Results  Component Value Date   CHOLHDL 4 07/07/2015   No results found for: HGBA1C     Assessment & Plan:   Problem List Items Addressed This Visit    None    Visit Diagnoses    Upper respiratory  tract infection, unspecified type    -  Primary   Relevant Medications   levocetirizine (XYZAL) 5 MG tablet   fluticasone (FLONASE) 50 MCG/ACT nasal spray   promethazine-dextromethorphan (PROMETHAZINE-DM) 6.25-15 MG/5ML syrup   Cough       Relevant Medications   levocetirizine (XYZAL) 5 MG tablet   fluticasone (FLONASE) 50 MCG/ACT nasal spray  promethazine-dextromethorphan (PROMETHAZINE-DM) 6.25-15 MG/5ML syrup      I have discontinued Ms. Dralle's pantoprazole, ALPRAZolam, azithromycin, and HYDROcodone-homatropine. I am also having her start on levocetirizine, fluticasone, and promethazine-dextromethorphan. Additionally, I am having her maintain her potassium chloride, Amlodipine-Valsartan-HCTZ, atorvastatin, and azelastine.  Meds ordered this encounter  Medications  . levocetirizine (XYZAL) 5 MG tablet    Sig: Take 1 tablet (5 mg total) by mouth every evening.    Dispense:  30 tablet    Refill:  5  . fluticasone (FLONASE) 50 MCG/ACT nasal spray    Sig: Place 2 sprays into both nostrils daily.    Dispense:  16 g    Refill:  6  . promethazine-dextromethorphan (PROMETHAZINE-DM) 6.25-15 MG/5ML syrup    Sig: Take 5 mLs by mouth 4 (four) times daily as needed.    Dispense:  118 mL    Refill:  0     [name], DO

## 2016-07-08 ENCOUNTER — Other Ambulatory Visit: Payer: Self-pay | Admitting: Family Medicine

## 2016-07-08 DIAGNOSIS — I10 Essential (primary) hypertension: Secondary | ICD-10-CM

## 2016-07-16 DIAGNOSIS — Z1231 Encounter for screening mammogram for malignant neoplasm of breast: Secondary | ICD-10-CM | POA: Diagnosis not present

## 2016-07-16 LAB — HM MAMMOGRAPHY

## 2016-07-21 ENCOUNTER — Encounter: Payer: Self-pay | Admitting: *Deleted

## 2016-08-05 DIAGNOSIS — S90561A Insect bite (nonvenomous), right ankle, initial encounter: Secondary | ICD-10-CM | POA: Diagnosis not present

## 2016-09-06 DIAGNOSIS — Z01419 Encounter for gynecological examination (general) (routine) without abnormal findings: Secondary | ICD-10-CM | POA: Diagnosis not present

## 2016-09-06 DIAGNOSIS — Z6833 Body mass index (BMI) 33.0-33.9, adult: Secondary | ICD-10-CM | POA: Diagnosis not present

## 2016-10-28 ENCOUNTER — Encounter: Payer: Self-pay | Admitting: Family Medicine

## 2016-10-29 ENCOUNTER — Ambulatory Visit (INDEPENDENT_AMBULATORY_CARE_PROVIDER_SITE_OTHER): Payer: BLUE CROSS/BLUE SHIELD | Admitting: Family Medicine

## 2016-10-29 ENCOUNTER — Encounter: Payer: Self-pay | Admitting: Family Medicine

## 2016-10-29 VITALS — BP 132/94 | HR 79 | Temp 98.0°F | Ht 66.3 in | Wt 206.8 lb

## 2016-10-29 DIAGNOSIS — R42 Dizziness and giddiness: Secondary | ICD-10-CM | POA: Diagnosis not present

## 2016-10-29 DIAGNOSIS — J069 Acute upper respiratory infection, unspecified: Secondary | ICD-10-CM | POA: Diagnosis not present

## 2016-10-29 DIAGNOSIS — R05 Cough: Secondary | ICD-10-CM | POA: Diagnosis not present

## 2016-10-29 DIAGNOSIS — R059 Cough, unspecified: Secondary | ICD-10-CM | POA: Insufficient documentation

## 2016-10-29 DIAGNOSIS — I10 Essential (primary) hypertension: Secondary | ICD-10-CM

## 2016-10-29 MED ORDER — AMOXICILLIN-POT CLAVULANATE 875-125 MG PO TABS: 1.0000 | ORAL_TABLET | Freq: Two times a day (BID) | ORAL | 0 refills | Status: DC

## 2016-10-29 MED ORDER — OLMESARTAN MEDOXOMIL-HCTZ 40-25 MG PO TABS: 1.0000 | ORAL_TABLET | Freq: Every day | ORAL | 2 refills | Status: DC

## 2016-10-29 MED ORDER — AMLODIPINE BESYLATE 10 MG PO TABS: 10.0000 mg | ORAL_TABLET | Freq: Every day | ORAL | 3 refills | Status: DC

## 2016-10-29 MED ORDER — LEVOCETIRIZINE DIHYDROCHLORIDE 5 MG PO TABS: 5.0000 mg | ORAL_TABLET | Freq: Every evening | ORAL | 5 refills | Status: DC

## 2016-10-29 NOTE — Progress Notes (Signed)
Patient ID: Diana Randall, female    DOB: [birth year]  Age: 56 y.o. MRN: [id number]    Subjective:  Subjective  HPI DESHONNA TRNKA presents for c/o dizziness and high bp.  She stopped taking her exforge because it was recalled.  No cp, no sob   Review of Systems  Constitutional: Negative for chills and fever.  HENT: Positive for sinus pain and sinus pressure. Negative for congestion, postnasal drip, rhinorrhea, sneezing and sore throat.   Respiratory: Negative for cough, chest tightness, shortness of breath and wheezing.   Cardiovascular: Negative for chest pain, palpitations and leg swelling.  Allergic/Immunologic: Negative for environmental allergies.    History Past Medical History:  Diagnosis Date  . GERD (gastroesophageal reflux disease)   . Hyperlipidemia   . Hypertension     She has no past surgical history on file.   Her family history is not on file.She reports that she has never smoked. She has never used smokeless tobacco. She reports that she drinks alcohol. She reports that she does not use drugs.  Current Outpatient Prescriptions on File Prior to Visit  Medication Sig Dispense Refill  . atorvastatin (LIPITOR) 40 MG tablet Take 1 tablet (40 mg total) by mouth daily. 90 tablet 1  . fluticasone (FLONASE) 50 MCG/ACT nasal spray Place 2 sprays into both nostrils daily. 16 g 6  . potassium chloride (K-DUR) 10 MEQ tablet 2 po qd 60 tablet 2  . azelastine (ASTELIN) 0.1 % nasal spray Place 2 sprays into both nostrils at bedtime as needed for rhinitis. Use in each nostril as directed (Patient not taking: Reported on 10/29/2016) 30 mL 3  . promethazine-dextromethorphan (PROMETHAZINE-DM) 6.25-15 MG/5ML syrup Take 5 mLs by mouth 4 (four) times daily as needed. (Patient not taking: Reported on 10/29/2016) 118 mL 0   No current facility-administered medications on file prior to visit.      Objective:  Objective  Physical Exam  Constitutional: She is oriented to person,  place, and time. She appears well-developed and well-nourished.  HENT:  Right Ear: Hearing, tympanic membrane, external ear and ear canal normal.  Left Ear: Tympanic membrane and external ear normal.  Nose: Right sinus exhibits frontal sinus tenderness. Right sinus exhibits no maxillary sinus tenderness. Left sinus exhibits frontal sinus tenderness. Left sinus exhibits no maxillary sinus tenderness.  + PND + errythema  Eyes: Conjunctivae are normal. Right eye exhibits no discharge. Left eye exhibits no discharge.  Cardiovascular: Normal rate, regular rhythm and normal heart sounds.   No murmur heard. Pulmonary/Chest: Effort normal and breath sounds normal. No respiratory distress. She has no wheezes. She has no rales. She exhibits no tenderness.  Musculoskeletal: She exhibits no edema.  Lymphadenopathy:    She has no cervical adenopathy.  Neurological: She is alert and oriented to person, place, and time.  Nursing note and vitals reviewed.  BP (!) 132/94 (BP Location: Right Arm, Patient Position: Sitting)   Pulse 79   Temp 98 F (36.7 C)   Ht 5' 6.3" (1.684 m)   Wt 206 lb 12.8 oz (93.8 kg)   LMP 10/09/2016   SpO2 97%   BMI 33.08 kg/m  Wt Readings from Last 3 Encounters:  10/29/16 206 lb 12.8 oz (93.8 kg)  06/18/16 201 lb 3.2 oz (91.3 kg)  03/18/16 196 lb 6 oz (89.1 kg)     Lab Results  Component Value Date   WBC 10.6 (H) 11/05/2014   HGB 12.9 11/05/2014   HCT 38.6 11/05/2014   PLT  294.0 11/05/2014   GLUCOSE 87 07/07/2015   CHOL 253 (H) 07/07/2015   TRIG 62.0 07/07/2015   HDL 62.00 07/07/2015   LDLDIRECT 151.9 11/06/2012   LDLCALC 179 (H) 07/07/2015   ALT 12 07/07/2015   AST 15 07/07/2015   NA 137 07/07/2015   K 3.5 07/07/2015   CL 100 07/07/2015   CREATININE 0.75 07/07/2015   BUN 15 07/07/2015   CO2 31 07/07/2015   TSH 1.35 04/22/2014   MICROALBUR 1.2 11/06/2012    US Transvaginal Non-ob  Result Date: 11/08/2014 CLINICAL DATA:  Left lower quadrant pain for  3 days. Abnormal appearing endometrial thickness seen on recent CT. LMP 10/23/2014. EXAM: TRANSABDOMINAL AND TRANSVAGINAL ULTRASOUND OF PELVIS TECHNIQUE: Both transabdominal and transvaginal ultrasound examinations of the pelvis were performed. Transabdominal technique was performed for global imaging of the pelvis including uterus, ovaries, adnexal regions, and pelvic cul-de-sac. It was necessary to proceed with endovaginal exam following the transabdominal exam to visualize the endometrial stripe and ovaries. COMPARISON:  CT on 11/05/2014 FINDINGS: Uterus Measurements: 8.5 x 4.8 x 5.4 cm. Diffusely heterogeneous echotexture uterine myometrium noted, but no distinct fibroids visualized. Endometrium Thickness: 13 mm.  No focal abnormality visualized. Right ovary Measurements: 2.7 x 1.2 x 0.9 cm. Normal appearance/no adnexal mass. Left ovary Measurements: 3.6 x 2.2 x 3.1 cm. Small 2 cm involuting corpus luteum cyst noted. Otherwise normal appearance/no adnexal mass. Other findings No free fluid. IMPRESSION: Endometrial thickness measures 13 mm, which is considered within normal limits for a premenopausal female. No definite fibroids or adnexal mass identified. Electronically Signed   By: Myles Rosenthal M.D.   On: 11/08/2014 08:52   US Pelvis Complete  Result Date: 11/08/2014 CLINICAL DATA:  Left lower quadrant pain for 3 days. Abnormal appearing endometrial thickness seen on recent CT. LMP 10/23/2014. EXAM: TRANSABDOMINAL AND TRANSVAGINAL ULTRASOUND OF PELVIS TECHNIQUE: Both transabdominal and transvaginal ultrasound examinations of the pelvis were performed. Transabdominal technique was performed for global imaging of the pelvis including uterus, ovaries, adnexal regions, and pelvic cul-de-sac. It was necessary to proceed with endovaginal exam following the transabdominal exam to visualize the endometrial stripe and ovaries. COMPARISON:  CT on 11/05/2014 FINDINGS: Uterus Measurements: 8.5 x 4.8 x 5.4 cm. Diffusely  heterogeneous echotexture uterine myometrium noted, but no distinct fibroids visualized. Endometrium Thickness: 13 mm.  No focal abnormality visualized. Right ovary Measurements: 2.7 x 1.2 x 0.9 cm. Normal appearance/no adnexal mass. Left ovary Measurements: 3.6 x 2.2 x 3.1 cm. Small 2 cm involuting corpus luteum cyst noted. Otherwise normal appearance/no adnexal mass. Other findings No free fluid. IMPRESSION: Endometrial thickness measures 13 mm, which is considered within normal limits for a premenopausal female. No definite fibroids or adnexal mass identified. Electronically Signed   By: Myles Rosenthal M.D.   On: 11/08/2014 08:52     Assessment & Plan:  Plan  I have discontinued Ms. Hotard's Amlodipine-Valsartan-HCTZ and Amlodipine-Valsartan-HCTZ. I am also having her start on olmesartan-hydrochlorothiazide, amLODipine, and amoxicillin-clavulanate. Additionally, I am having her maintain her potassium chloride, atorvastatin, azelastine, fluticasone, promethazine-dextromethorphan, and levocetirizine.  Meds ordered this encounter  Medications  . olmesartan-hydrochlorothiazide (BENICAR HCT) 40-25 MG tablet    Sig: Take 1 tablet by mouth daily.    Dispense:  30 tablet    Refill:  2  . amLODipine (NORVASC) 10 MG tablet    Sig: Take 1 tablet (10 mg total) by mouth daily.    Dispense:  30 tablet    Refill:  3  . levocetirizine (XYZAL) 5 MG  tablet    Sig: Take 1 tablet (5 mg total) by mouth every evening.    Dispense:  30 tablet    Refill:  5  . amoxicillin-clavulanate (AUGMENTIN) 875-125 MG tablet    Sig: Take 1 tablet by mouth 2 (two) times daily.    Dispense:  20 tablet    Refill:  0    Problem List Items Addressed This Visit      Unprioritized   Cough   Relevant Medications   levocetirizine (XYZAL) 5 MG tablet   Dizzy - Primary    Most likely from htn ekg-- nsr  Start benicar hct and norvasc rto 2-3 weeks for bp check      Relevant Orders   EKG 12-Lead (Completed)   CBC with  Differential/Platelet   Comprehensive metabolic panel   TSH   Vitamin B12   POCT Urinalysis Dipstick (Automated)   Essential hypertension    Poorly controlled will alter medications, encouraged DASH diet, minimize caffeine and obtain adequate sleep. Report concerning symptoms and follow up as directed and as needed Pt stopped her meds  Start benicar hct 40/25 and norvasc 10 mg  F/u 2-3 weeks       Relevant Medications   olmesartan-hydrochlorothiazide (BENICAR HCT) 40-25 MG tablet   amLODipine (NORVASC) 10 MG tablet   Upper respiratory tract infection   Relevant Medications   levocetirizine (XYZAL) 5 MG tablet   amoxicillin-clavulanate (AUGMENTIN) 875-125 MG tablet      Follow-up: Return in about 3 weeks (around 11/19/2016) for hypertension.  [name], DO

## 2016-10-29 NOTE — Patient Instructions (Signed)

## 2016-10-29 NOTE — Telephone Encounter (Signed)
Saw pt today

## 2016-10-29 NOTE — Assessment & Plan Note (Signed)
Poorly controlled will alter medications, encouraged DASH diet, minimize caffeine and obtain adequate sleep. Report concerning symptoms and follow up as directed and as needed Pt stopped her meds  Start benicar hct 40/25 and norvasc 10 mg  F/u 2-3 weeks

## 2016-10-29 NOTE — Assessment & Plan Note (Signed)
Most likely from htn ekg-- nsr  Start benicar hct and norvasc rto 2-3 weeks for bp check

## 2016-11-25 ENCOUNTER — Ambulatory Visit (INDEPENDENT_AMBULATORY_CARE_PROVIDER_SITE_OTHER): Payer: BLUE CROSS/BLUE SHIELD

## 2016-11-25 ENCOUNTER — Encounter: Payer: Self-pay | Admitting: Podiatry

## 2016-11-25 ENCOUNTER — Ambulatory Visit (INDEPENDENT_AMBULATORY_CARE_PROVIDER_SITE_OTHER): Payer: BLUE CROSS/BLUE SHIELD | Admitting: Podiatry

## 2016-11-25 ENCOUNTER — Other Ambulatory Visit: Payer: Self-pay | Admitting: Podiatry

## 2016-11-25 VITALS — BP 123/85 | HR 78 | Resp 16 | Ht 65.0 in

## 2016-11-25 DIAGNOSIS — M779 Enthesopathy, unspecified: Secondary | ICD-10-CM

## 2016-11-25 DIAGNOSIS — M25571 Pain in right ankle and joints of right foot: Secondary | ICD-10-CM

## 2016-11-25 MED ORDER — DICLOFENAC SODIUM 75 MG PO TBEC
75.0000 mg | DELAYED_RELEASE_TABLET | Freq: Two times a day (BID) | ORAL | 2 refills | Status: DC
Start: 2016-11-25 — End: 2018-01-24

## 2016-11-25 NOTE — Progress Notes (Signed)
Subjective:    Patient ID: Diana Randall, female   DOB: 56 y.o.   MRN: [id number]   HPI patient presents stating that she has a significant sprain of her right ankle which occurred 10 days ago and it's remained very sharp and she's concerned about fracture    Review of Systems  All other systems reviewed and are negative.       Objective:  Physical Exam  Constitutional: She appears well-developed and well-nourished.  Cardiovascular: Intact distal pulses.   Pulmonary/Chest: Effort normal.  Musculoskeletal: Normal range of motion.  Neurological: She is alert.  Skin: Skin is warm.  Nursing note and vitals reviewed.  neurovascular status found to be intact muscle strength was adequate with patient found to have range of motion of the right ankle that is adequate with no indication of instability. It is quite sore with motion there is edema on the lateral side mostly within the anterior talar and calcaneal fibular ligaments but it is structurally sound. There is mild discomfort occurring distally but not significant and patient does have moderate edema with negative Homans sign noted     Assessment:    Acute ankle sprain right with mild instability and possibility for fracture     Plan:    H&P x-rays reviewed and today I went ahead and I applied a above ankle Tri-Lock ankle brace to stabilize the right ankle along with ankle compression stocking elevation and at times inflammatory treatment. Patient will wear this for the next 3 weeks and gradually reduce it over the following 4 weeks and will be seen back as needed  X-rays indicate the ankle appears stable with no indication of fracture or diastases injury

## 2016-11-25 NOTE — Progress Notes (Signed)
   Subjective:    Patient ID: Diana Randall, female    DOB: May 09, 1960, 56 y.o.   MRN: [id number]  HPI    Review of Systems  All other systems reviewed and are negative.      Objective:   Physical Exam        Assessment & Plan:

## 2016-12-15 DIAGNOSIS — H8110 Benign paroxysmal vertigo, unspecified ear: Secondary | ICD-10-CM | POA: Diagnosis not present

## 2017-03-17 DIAGNOSIS — J029 Acute pharyngitis, unspecified: Secondary | ICD-10-CM | POA: Diagnosis not present

## 2017-03-17 DIAGNOSIS — J069 Acute upper respiratory infection, unspecified: Secondary | ICD-10-CM | POA: Diagnosis not present

## 2017-05-05 ENCOUNTER — Ambulatory Visit: Payer: BLUE CROSS/BLUE SHIELD | Admitting: Family Medicine

## 2017-05-05 ENCOUNTER — Ambulatory Visit (HOSPITAL_BASED_OUTPATIENT_CLINIC_OR_DEPARTMENT_OTHER)
Admission: RE | Admit: 2017-05-05 | Discharge: 2017-05-05 | Disposition: A | Discharge status: Home / Self Care | Payer: BLUE CROSS/BLUE SHIELD | Source: Ambulatory Visit | Attending: Family Medicine | Admitting: Family Medicine

## 2017-05-05 ENCOUNTER — Encounter: Payer: Self-pay | Admitting: Family Medicine

## 2017-05-05 VITALS — BP 143/86 | HR 108 | Temp 100.9°F | Resp 16 | Ht 65.0 in | Wt 203.0 lb

## 2017-05-05 DIAGNOSIS — R05 Cough: Secondary | ICD-10-CM | POA: Diagnosis not present

## 2017-05-05 DIAGNOSIS — R52 Pain, unspecified: Secondary | ICD-10-CM | POA: Diagnosis not present

## 2017-05-05 DIAGNOSIS — R509 Fever, unspecified: Secondary | ICD-10-CM

## 2017-05-05 DIAGNOSIS — R0602 Shortness of breath: Secondary | ICD-10-CM | POA: Diagnosis not present

## 2017-05-05 LAB — POC INFLUENZA A&B (BINAX/QUICKVUE)
Influenza A, POC: NEGATIVE
Influenza B, POC: NEGATIVE

## 2017-05-05 MED ORDER — OSELTAMIVIR PHOSPHATE 75 MG PO CAPS: 75.0000 mg | ORAL_CAPSULE | Freq: Two times a day (BID) | ORAL | 0 refills | Status: DC

## 2017-05-05 NOTE — Progress Notes (Signed)
[facility] [address], [address] [place], [place] [address] [phone] [phone]  Date:  05/05/2017   Name:  Diana AntonioJacqueline M Fisch   DOB:  10/20/1960   MRN:  [id number]  PCP:  Donato SchultzLowne Chase, Yvonne R, DO    Chief Complaint: Flu like symptoms (body aches, cold chills, dry cough, wheezing, chest "feels thick", took nyquil)   History of Present Illness:  Diana Randall is a 57 y.o. very pleasant female patient who presents with the following:  Here today with illness that started yesterday during her work day- noted chills, cough, felt awful when she got home from work, just went to bed but did not rest well due to feeling so cold She was "freezing", no sweats however She notes body aches Dry cough No ST No vomiting or diarrhea No rash No much appetite but she is able to eat She has tried some mucinex, nyquil for her sx Did not take any fever reducer today- she avoids NSAIDS due to her BP and we did not have any tylenol in clinic to offer her No urinary sx   She is generally healthy except for HTN, cholesterol   Did not have a flu shot this last year   Patient Active Problem List   Diagnosis Date Noted  . Cough 10/29/2016  . Upper respiratory tract infection 10/29/2016  . Dizzy 10/29/2016  . HTN (hypertension) 11/06/2014  . Acute bronchitis 03/09/2013  . Obesity (BMI 30-39.9) 11/23/2012  . Radicular leg pain 10/15/2010  . Hyperlipidemia 01/07/2009  . CHEST PAIN UNSPECIFIED 12/23/2008  . SHOULDER PAIN, RIGHT 09/16/2008  . MURMUR 01/01/2008  . Essential hypertension 12/18/2007    Past Medical History:  Diagnosis Date  . GERD (gastroesophageal reflux disease)   . Hyperlipidemia   . Hypertension     No past surgical history on file.  Social History   Tobacco Use  . Smoking status: Never Smoker  . Smokeless tobacco: Never Used  Substance Use Topics  . Alcohol use: Yes  . Drug use: No    No family history on  file.  Allergies  Allergen Reactions  . Latex Rash    Medication list has been reviewed and updated.  Current Outpatient Medications on File Prior to Visit  Medication Sig Dispense Refill  . amLODipine (NORVASC) 10 MG tablet Take 1 tablet (10 mg total) by mouth daily. 30 tablet 3  . atorvastatin (LIPITOR) 40 MG tablet Take 1 tablet (40 mg total) by mouth daily. 90 tablet 1  . diclofenac (VOLTAREN) 75 MG EC tablet Take 1 tablet (75 mg total) by mouth 2 (two) times daily. 50 tablet 2  . olmesartan-hydrochlorothiazide (BENICAR HCT) 40-25 MG tablet Take 1 tablet by mouth daily. 30 tablet 2  . potassium chloride (K-DUR) 10 MEQ tablet 2 po qd 60 tablet 2   No current facility-administered medications on file prior to visit.     Review of Systems:  As per HPI- otherwise negative.   Physical Examination: Vitals:   05/05/17 1613  BP: (!) 143/86  Pulse: (!) 108  Resp: 16  Temp: (!) 100.9 F (38.3 C)  SpO2: 99%   Vitals:   05/05/17 1613  Weight: 203 lb (92.1 kg)  Height: 5\' 5"  (1.651 m)   Body mass index is 33.78 kg/m. Ideal Body Weight: Weight in (lb) to have BMI = 25: 149.9  GEN: WDWN, NAD, Non-toxic, A & O x 3, does not appear ill but  does have a slight fever today  HEENT: Atraumatic, Normocephalic. Neck supple. No masses, No LAD.  Bilateral TM wnl, oropharynx normal.  PEERL,EOMI.   Ears and Nose: No external deformity. CV: RRR, No M/G/R. No JVD. No thrill. No extra heart sounds. PULM: CTA B, no wheezes, crackles, rhonchi. No retractions. No resp. distress. No accessory muscle use. ABD: S, NT, ND, +BS. No rebound. No HSM. EXTR: No c/c/e NEURO Normal gait.  PSYCH: Normally interactive. Conversant. Not depressed or anxious appearing.  Calm demeanor.   Results for orders placed or performed in visit on 05/05/17  POC Influenza A&B (Binax test)  Result Value Ref Range   Influenza A, POC Negative Negative   Influenza B, POC Negative Negative   Dg Chest 2 View  Result  Date: 05/05/2017 CLINICAL DATA:  Dry cough, shortness of breath, wheezing and fever worse since yesterday, body aches, history hypertension EXAM: CHEST - 2 VIEW COMPARISON:  None FINDINGS: Normal heart size, mediastinal contours, and pulmonary vascularity. Lungs clear. No pleural effusion or pneumothorax. Bones unremarkable. IMPRESSION: Normal exam. Electronically Signed   By: Ulyses Southward M.D.   On: 05/05/2017 16:45    Assessment and Plan: Fever, unspecified fever cause - Plan: POC Influenza A&B (Binax test), DG Chest 2 View, oseltamivir (TAMIFLU) 75 MG capsule  Body aches - Plan: POC Influenza A&B (Binax test), DG Chest 2 View, oseltamivir (TAMIFLU) 75 MG capsule  Here today with flu like sx and low grade fever.  Rapid flu negative so obtained CXR as well to make sure no pneumonia.  Negative for pneumonia Treat for flu with tamiflu Discussed OTC meds that may be helpful Note for her work She will contact us if not feeling better or otherwise seek care as needed    Signed Abbe Amsterdam, MD

## 2017-05-05 NOTE — Patient Instructions (Addendum)
Please use tyelnol as needed for fever Delsym is a good med for cough Let me know if you are not feeling better in the next few days- Sooner if worse.  We are going to treat you with tamiflu for presumed flu

## 2017-07-19 DIAGNOSIS — Z1231 Encounter for screening mammogram for malignant neoplasm of breast: Secondary | ICD-10-CM | POA: Diagnosis not present

## 2017-07-19 LAB — HM MAMMOGRAPHY

## 2017-07-27 ENCOUNTER — Encounter: Payer: Self-pay | Admitting: Family Medicine

## 2017-08-08 ENCOUNTER — Encounter: Payer: Self-pay | Admitting: Family Medicine

## 2017-08-08 ENCOUNTER — Ambulatory Visit: Payer: BLUE CROSS/BLUE SHIELD | Admitting: Family Medicine

## 2017-08-08 DIAGNOSIS — E876 Hypokalemia: Secondary | ICD-10-CM | POA: Diagnosis not present

## 2017-08-08 DIAGNOSIS — I10 Essential (primary) hypertension: Secondary | ICD-10-CM | POA: Diagnosis not present

## 2017-08-08 DIAGNOSIS — E785 Hyperlipidemia, unspecified: Secondary | ICD-10-CM | POA: Diagnosis not present

## 2017-08-08 MED ORDER — ATORVASTATIN CALCIUM 40 MG PO TABS: 40.0000 mg | ORAL_TABLET | Freq: Every day | ORAL | 1 refills | Status: DC

## 2017-08-08 MED ORDER — POTASSIUM CHLORIDE ER 10 MEQ PO TBCR: EXTENDED_RELEASE_TABLET | ORAL | 2 refills | Status: DC

## 2017-08-08 MED ORDER — AMLODIPINE BESYLATE 10 MG PO TABS: 10.0000 mg | ORAL_TABLET | Freq: Every day | ORAL | 3 refills | Status: DC

## 2017-08-08 MED ORDER — OLMESARTAN MEDOXOMIL-HCTZ 40-25 MG PO TABS: 1.0000 | ORAL_TABLET | Freq: Every day | ORAL | 2 refills | Status: DC

## 2017-08-08 NOTE — Progress Notes (Signed)
Subjective:  I acted as a Neurosurgeon for [employer]. Fuller Song, RMA   Patient ID: Diana Randall, female    DOB: Mar 17, 1960, 57 y.o.   MRN: [id number]  Chief Complaint  Patient presents with  . Medication Refill    HPI  Patient is in today for medication refill.  Pt has not taken her meds in 3 months.  She states she just got busy taking care of her dad---    Patient Care Team: Zola Button, Grayling Congress, DO as PCP - General Vida Rigger, MD as Consulting Physician (Gastroenterology)   Past Medical History:  Diagnosis Date  . GERD (gastroesophageal reflux disease)   . Hyperlipidemia   . Hypertension     History reviewed. No pertinent surgical history.  History reviewed. No pertinent family history.  Social History   Socioeconomic History  . Marital status: Single    Spouse name: Not on file  . Number of children: Not on file  . Years of education: Not on file  . Highest education level: Not on file  Occupational History  . Not on file  Social Needs  . Financial resource strain: Not on file  . Food insecurity:    Worry: Not on file    Inability: Not on file  . Transportation needs:    Medical: Not on file    Non-medical: Not on file  Tobacco Use  . Smoking status: Never Smoker  . Smokeless tobacco: Never Used  Substance and Sexual Activity  . Alcohol use: Yes  . Drug use: No  . Sexual activity: Not Currently    Partners: Male  Lifestyle  . Physical activity:    Days per week: Not on file    Minutes per session: Not on file  . Stress: Not on file  Relationships  . Social connections:    Talks on phone: Not on file    Gets together: Not on file    Attends religious service: Not on file    Active member of club or organization: Not on file    Attends meetings of clubs or organizations: Not on file    Relationship status: Not on file  . Intimate partner violence:    Fear of current or ex partner: Not on file    Emotionally abused: Not on file   Physically abused: Not on file    Forced sexual activity: Not on file  Other Topics Concern  . Not on file  Social History Narrative  . Not on file    Outpatient Medications Prior to Visit  Medication Sig Dispense Refill  . diclofenac (VOLTAREN) 75 MG EC tablet Take 1 tablet (75 mg total) by mouth 2 (two) times daily. 50 tablet 2  . oseltamivir (TAMIFLU) 75 MG capsule Take 1 capsule (75 mg total) by mouth 2 (two) times daily. 10 capsule 0  . amLODipine (NORVASC) 10 MG tablet Take 1 tablet (10 mg total) by mouth daily. 30 tablet 3  . atorvastatin (LIPITOR) 40 MG tablet Take 1 tablet (40 mg total) by mouth daily. 90 tablet 1  . olmesartan-hydrochlorothiazide (BENICAR HCT) 40-25 MG tablet Take 1 tablet by mouth daily. 30 tablet 2  . potassium chloride (K-DUR) 10 MEQ tablet 2 po qd 60 tablet 2   No facility-administered medications prior to visit.     Allergies  Allergen Reactions  . Latex Rash    Review of Systems  Constitutional: Negative for chills, fever and malaise/fatigue.  HENT: Negative for congestion and hearing loss.  Eyes: Negative for discharge.  Respiratory: Negative for cough, sputum production and shortness of breath.   Cardiovascular: Negative for chest pain, palpitations and leg swelling.  Gastrointestinal: Negative for abdominal pain, blood in stool, constipation, diarrhea, heartburn, nausea and vomiting.  Genitourinary: Negative for dysuria, frequency, hematuria and urgency.  Musculoskeletal: Negative for back pain, falls and myalgias.  Skin: Negative for rash.  Neurological: Negative for dizziness, sensory change, loss of consciousness, weakness and headaches.  Endo/Heme/Allergies: Negative for environmental allergies. Does not bruise/bleed easily.  Psychiatric/Behavioral: Negative for depression and suicidal ideas. The patient is not nervous/anxious and does not have insomnia.        Objective:    Physical Exam  Constitutional: She is oriented to person,  place, and time. She appears well-developed and well-nourished.  HENT:  Head: Normocephalic and atraumatic.  Eyes: Conjunctivae and EOM are normal.  Neck: Normal range of motion. Neck supple. No JVD present. Carotid bruit is not present. No thyromegaly present.  Cardiovascular: Normal rate, regular rhythm and normal heart sounds.  No murmur heard. Pulmonary/Chest: Effort normal and breath sounds normal. No respiratory distress. She has no wheezes. She has no rales. She exhibits no tenderness.  Musculoskeletal: She exhibits no edema.  Neurological: She is alert and oriented to person, place, and time.  Psychiatric: She has a normal mood and affect.  Nursing note and vitals reviewed.   BP (!) 154/94 (BP Location: Left Arm, Patient Position: Sitting, Cuff Size: Large)   Pulse 84   Temp 98.1 F (36.7 C) (Oral)   Resp 18   Wt 194 lb 6.4 oz (88.2 kg)   SpO2 97%   BMI 32.35 kg/m  Wt Readings from Last 3 Encounters:  08/08/17 194 lb 6.4 oz (88.2 kg)  05/05/17 203 lb (92.1 kg)  10/29/16 206 lb 12.8 oz (93.8 kg)   BP Readings from Last 3 Encounters:  08/08/17 (!) 154/94  05/05/17 (!) 143/86  11/25/16 123/85      There is no immunization history on file for this patient.  Health Maintenance  Topic Date Due  . Hepatitis C Screening  1960-06-24  . HIV Screening  02/19/1975  . TETANUS/TDAP  02/19/1979  . PAP SMEAR  02/11/2017  . INFLUENZA VACCINE  09/15/2017  . MAMMOGRAM  07/20/2018  . COLONOSCOPY  09/28/2025    Lab Results  Component Value Date   WBC 10.6 (H) 11/05/2014   HGB 12.9 11/05/2014   HCT 38.6 11/05/2014   PLT 294.0 11/05/2014   GLUCOSE 87 07/07/2015   CHOL 253 (H) 07/07/2015   TRIG 62.0 07/07/2015   HDL 62.00 07/07/2015   LDLDIRECT 151.9 11/06/2012   LDLCALC 179 (H) 07/07/2015   ALT 12 07/07/2015   AST 15 07/07/2015   NA 137 07/07/2015   K 3.5 07/07/2015   CL 100 07/07/2015   CREATININE 0.75 07/07/2015   BUN 15 07/07/2015   CO2 31 07/07/2015   TSH 1.35  04/22/2014   MICROALBUR 1.2 11/06/2012    Lab Results  Component Value Date   TSH 1.35 04/22/2014   Lab Results  Component Value Date   WBC 10.6 (H) 11/05/2014   HGB 12.9 11/05/2014   HCT 38.6 11/05/2014   MCV 90.9 11/05/2014   PLT 294.0 11/05/2014   Lab Results  Component Value Date   NA 137 07/07/2015   K 3.5 07/07/2015   CO2 31 07/07/2015   GLUCOSE 87 07/07/2015   BUN 15 07/07/2015   CREATININE 0.75 07/07/2015   BILITOT 0.9 07/07/2015  ALKPHOS 64 07/07/2015   AST 15 07/07/2015   ALT 12 07/07/2015   PROT 6.9 07/07/2015   ALBUMIN 4.0 07/07/2015   CALCIUM 9.3 07/07/2015   GFR 103.03 07/07/2015   Lab Results  Component Value Date   CHOL 253 (H) 07/07/2015   Lab Results  Component Value Date   HDL 62.00 07/07/2015   Lab Results  Component Value Date   LDLCALC 179 (H) 07/07/2015   Lab Results  Component Value Date   TRIG 62.0 07/07/2015   Lab Results  Component Value Date   CHOLHDL 4 07/07/2015   No results found for: HGBA1C       Assessment & Plan:   Problem List Items Addressed This Visit      Unprioritized   Essential hypertension    Poorly controlled will alter medications, encouraged DASH diet, minimize caffeine and obtain adequate sleep. Report concerning symptoms and follow up as directed and as needed Pt was not taking her meds.        Relevant Medications   olmesartan-hydrochlorothiazide (BENICAR HCT) 40-25 MG tablet   atorvastatin (LIPITOR) 40 MG tablet   amLODipine (NORVASC) 10 MG tablet   Other Relevant Orders   Lipid panel   CBC with Differential/Platelet   Comprehensive metabolic panel   Hyperlipidemia    Encouraged heart healthy diet, increase exercise, avoid trans fats, consider a krill oil cap daily      Relevant Medications   olmesartan-hydrochlorothiazide (BENICAR HCT) 40-25 MG tablet   atorvastatin (LIPITOR) 40 MG tablet   amLODipine (NORVASC) 10 MG tablet   Other Relevant Orders   Lipid panel    Other Visit  Diagnoses    Hypokalemia       Relevant Medications   potassium chloride (K-DUR) 10 MEQ tablet   Other Relevant Orders   Comprehensive metabolic panel      I am having Diana Solar. Randall maintain her diclofenac, oseltamivir, olmesartan-hydrochlorothiazide, atorvastatin, potassium chloride, and amLODipine.  Meds ordered this encounter  Medications  . olmesartan-hydrochlorothiazide (BENICAR HCT) 40-25 MG tablet    Sig: Take 1 tablet by mouth daily.    Dispense:  30 tablet    Refill:  2  . atorvastatin (LIPITOR) 40 MG tablet    Sig: Take 1 tablet (40 mg total) by mouth daily.    Dispense:  90 tablet    Refill:  1  . potassium chloride (K-DUR) 10 MEQ tablet    Sig: 2 po qd    Dispense:  60 tablet    Refill:  2  . amLODipine (NORVASC) 10 MG tablet    Sig: Take 1 tablet (10 mg total) by mouth daily.    Dispense:  30 tablet    Refill:  3    CMA served as scribe during this visit. History, Physical and Plan performed by medical provider. Documentation and orders reviewed and attested to.  [name], DO

## 2017-08-08 NOTE — Assessment & Plan Note (Signed)
Encouraged heart healthy diet, increase exercise, avoid trans fats, consider a krill oil cap daily 

## 2017-08-08 NOTE — Assessment & Plan Note (Signed)
Poorly controlled will alter medications, encouraged DASH diet, minimize caffeine and obtain adequate sleep. Report concerning symptoms and follow up as directed and as needed Pt was not taking her meds.

## 2017-08-08 NOTE — Patient Instructions (Signed)
DASH Eating Plan DASH stands for "Dietary Approaches to Stop Hypertension." The DASH eating plan is a healthy eating plan that has been shown to reduce high blood pressure (hypertension). It may also reduce your risk for type 2 diabetes, heart disease, and stroke. The DASH eating plan may also help with weight loss. What are tips for following this plan? General guidelines  Avoid eating more than 2,300 mg (milligrams) of salt (sodium) a day. If you have hypertension, you may need to reduce your sodium intake to 1,500 mg a day.  Limit alcohol intake to no more than 1 drink a day for nonpregnant women and 2 drinks a day for men. One drink equals 12 oz of beer, 5 oz of wine, or 1 oz of hard liquor.  Work with your health care provider to maintain a healthy body weight or to lose weight. Ask what an ideal weight is for you.  Get at least 30 minutes of exercise that causes your heart to beat faster (aerobic exercise) most days of the week. Activities may include walking, swimming, or biking.  Work with your health care provider or diet and nutrition specialist (dietitian) to adjust your eating plan to your individual calorie needs. Reading food labels  Check food labels for the amount of sodium per serving. Choose foods with less than 5 percent of the Daily Value of sodium. Generally, foods with less than 300 mg of sodium per serving fit into this eating plan.  To find whole grains, look for the word "whole" as the first word in the ingredient list. Shopping  Buy products labeled as "low-sodium" or "no salt added."  Buy fresh foods. Avoid canned foods and premade or frozen meals. Cooking  Avoid adding salt when cooking. Use salt-free seasonings or herbs instead of table salt or sea salt. Check with your health care provider or pharmacist before using salt substitutes.  Do not fry foods. Cook foods using healthy methods such as baking, boiling, grilling, and broiling instead.  Cook with  heart-healthy oils, such as olive, canola, soybean, or sunflower oil. Meal planning   Eat a balanced diet that includes: ? 5 or more servings of fruits and vegetables each day. At each meal, try to fill half of your plate with fruits and vegetables. ? Up to 6-8 servings of whole grains each day. ? Less than 6 oz of lean meat, poultry, or fish each day. A 3-oz serving of meat is about the same size as a deck of cards. One egg equals 1 oz. ? 2 servings of low-fat dairy each day. ? A serving of nuts, seeds, or beans 5 times each week. ? Heart-healthy fats. Healthy fats called Omega-3 fatty acids are found in foods such as flaxseeds and coldwater fish, like sardines, salmon, and mackerel.  Limit how much you eat of the following: ? Canned or prepackaged foods. ? Food that is high in trans fat, such as fried foods. ? Food that is high in saturated fat, such as fatty meat. ? Sweets, desserts, sugary drinks, and other foods with added sugar. ? Full-fat dairy products.  Do not salt foods before eating.  Try to eat at least 2 vegetarian meals each week.  Eat more home-cooked food and less restaurant, buffet, and fast food.  When eating at a restaurant, ask that your food be prepared with less salt or no salt, if possible. What foods are recommended? The items listed may not be a complete list. Talk with your dietitian about what   dietary choices are best for you. Grains Whole-grain or whole-wheat bread. Whole-grain or whole-wheat pasta. Brown rice. Oatmeal. Quinoa. Bulgur. Whole-grain and low-sodium cereals. Pita bread. Low-fat, low-sodium crackers. Whole-wheat flour tortillas. Vegetables Fresh or frozen vegetables (raw, steamed, roasted, or grilled). Low-sodium or reduced-sodium tomato and vegetable juice. Low-sodium or reduced-sodium tomato sauce and tomato paste. Low-sodium or reduced-sodium canned vegetables. Fruits All fresh, dried, or frozen fruit. Canned fruit in natural juice (without  added sugar). Meat and other protein foods Skinless chicken or turkey. Ground chicken or turkey. Pork with fat trimmed off. Fish and seafood. Egg whites. Dried beans, peas, or lentils. Unsalted nuts, nut butters, and seeds. Unsalted canned beans. Lean cuts of beef with fat trimmed off. Low-sodium, lean deli meat. Dairy Low-fat (1%) or fat-free (skim) milk. Fat-free, low-fat, or reduced-fat cheeses. Nonfat, low-sodium ricotta or cottage cheese. Low-fat or nonfat yogurt. Low-fat, low-sodium cheese. Fats and oils Soft margarine without trans fats. Vegetable oil. Low-fat, reduced-fat, or light mayonnaise and salad dressings (reduced-sodium). Canola, safflower, olive, soybean, and sunflower oils. Avocado. Seasoning and other foods Herbs. Spices. Seasoning mixes without salt. Unsalted popcorn and pretzels. Fat-free sweets. What foods are not recommended? The items listed may not be a complete list. Talk with your dietitian about what dietary choices are best for you. Grains Baked goods made with fat, such as croissants, muffins, or some breads. Dry pasta or rice meal packs. Vegetables Creamed or fried vegetables. Vegetables in a cheese sauce. Regular canned vegetables (not low-sodium or reduced-sodium). Regular canned tomato sauce and paste (not low-sodium or reduced-sodium). Regular tomato and vegetable juice (not low-sodium or reduced-sodium). Pickles. Olives. Fruits Canned fruit in a light or heavy syrup. Fried fruit. Fruit in cream or butter sauce. Meat and other protein foods Fatty cuts of meat. Ribs. Fried meat. Bacon. Sausage. Bologna and other processed lunch meats. Salami. Fatback. Hotdogs. Bratwurst. Salted nuts and seeds. Canned beans with added salt. Canned or smoked fish. Whole eggs or egg yolks. Chicken or turkey with skin. Dairy Whole or 2% milk, cream, and half-and-half. Whole or full-fat cream cheese. Whole-fat or sweetened yogurt. Full-fat cheese. Nondairy creamers. Whipped toppings.  Processed cheese and cheese spreads. Fats and oils Butter. Stick margarine. Lard. Shortening. Ghee. Bacon fat. Tropical oils, such as coconut, palm kernel, or palm oil. Seasoning and other foods Salted popcorn and pretzels. Onion salt, garlic salt, seasoned salt, table salt, and sea salt. Worcestershire sauce. Tartar sauce. Barbecue sauce. Teriyaki sauce. Soy sauce, including reduced-sodium. Steak sauce. Canned and packaged gravies. Fish sauce. Oyster sauce. Cocktail sauce. Horseradish that you find on the shelf. Ketchup. Mustard. Meat flavorings and tenderizers. Bouillon cubes. Hot sauce and Tabasco sauce. Premade or packaged marinades. Premade or packaged taco seasonings. Relishes. Regular salad dressings. Where to find more information:  National Heart, Lung, and Blood Institute: www.nhlbi.nih.gov  American Heart Association: www.heart.org Summary  The DASH eating plan is a healthy eating plan that has been shown to reduce high blood pressure (hypertension). It may also reduce your risk for type 2 diabetes, heart disease, and stroke.  With the DASH eating plan, you should limit salt (sodium) intake to 2,300 mg a day. If you have hypertension, you may need to reduce your sodium intake to 1,500 mg a day.  When on the DASH eating plan, aim to eat more fresh fruits and vegetables, whole grains, lean proteins, low-fat dairy, and heart-healthy fats.  Work with your health care provider or diet and nutrition specialist (dietitian) to adjust your eating plan to your individual   calorie needs. This information is not intended to replace advice given to you by your health care provider. Make sure you discuss any questions you have with your health care provider. Document Released: 01/21/2011 Document Revised: 01/26/2016 Document Reviewed: 01/26/2016 Elsevier Interactive Patient Education  2018 [employer].  

## 2017-08-09 LAB — LIPID PANEL
CHOLESTEROL: 234 mg/dL — AB (ref 0–200)
HDL: 50.6 mg/dL (ref >39.00)
LDL Cholesterol: 158 mg/dL — ABNORMAL HIGH (ref 0–99)
NonHDL: 182.99
TRIGLYCERIDES: 125 mg/dL (ref 0.0–149.0)
Total CHOL/HDL Ratio: 5
VLDL: 25 mg/dL (ref 0.0–40.0)

## 2017-08-09 LAB — COMPREHENSIVE METABOLIC PANEL
ALBUMIN: 4 g/dL (ref 3.5–5.2)
ALT: 12 U/L (ref 0–35)
AST: 15 U/L (ref 0–37)
Alkaline Phosphatase: 85 U/L (ref 39–117)
BILIRUBIN TOTAL: 0.8 mg/dL (ref 0.2–1.2)
BUN: 14 mg/dL (ref 6–23)
CHLORIDE: 102 meq/L (ref 96–112)
CO2: 29 meq/L (ref 19–32)
CREATININE: 0.75 mg/dL (ref 0.40–1.20)
Calcium: 9.2 mg/dL (ref 8.4–10.5)
GFR: 102.26 mL/min (ref >60.00)
Glucose, Bld: 85 mg/dL (ref 70–99)
Potassium: 3.3 mEq/L — ABNORMAL LOW (ref 3.5–5.1)
SODIUM: 140 meq/L (ref 135–145)
Total Protein: 6.8 g/dL (ref 6.0–8.3)

## 2017-08-09 LAB — CBC WITH DIFFERENTIAL/PLATELET
Basophils Absolute: 0.1 10*3/uL (ref 0.0–0.1)
Basophils Relative: 1 % (ref 0.0–3.0)
EOS PCT: 5.2 % — AB (ref 0.0–5.0)
Eosinophils Absolute: 0.4 10*3/uL (ref 0.0–0.7)
HEMATOCRIT: 41.3 % (ref 36.0–46.0)
HEMOGLOBIN: 13.8 g/dL (ref 12.0–15.0)
Lymphocytes Relative: 38.4 % (ref 12.0–46.0)
Lymphs Abs: 2.8 10*3/uL (ref 0.7–4.0)
MCHC: 33.4 g/dL (ref 30.0–36.0)
MCV: 88.9 fl (ref 78.0–100.0)
MONO ABS: 0.4 10*3/uL (ref 0.1–1.0)
Monocytes Relative: 5.4 % (ref 3.0–12.0)
NEUTROS ABS: 3.6 10*3/uL (ref 1.4–7.7)
Neutrophils Relative %: 50 % (ref 43.0–77.0)
PLATELETS: 310 10*3/uL (ref 150.0–400.0)
RBC: 4.64 Mil/uL (ref 3.87–5.11)
RDW: 13.1 % (ref 11.5–15.5)
WBC: 7.2 10*3/uL (ref 4.0–10.5)

## 2017-08-12 NOTE — Addendum Note (Signed)
Addended by: Steve RattlerBLEVINS, Maeson Purohit A on: 08/12/2017 09:08 AM   Modules accepted: Orders

## 2017-09-15 DIAGNOSIS — Z6831 Body mass index (BMI) 31.0-31.9, adult: Secondary | ICD-10-CM | POA: Diagnosis not present

## 2017-09-15 DIAGNOSIS — Z01419 Encounter for gynecological examination (general) (routine) without abnormal findings: Secondary | ICD-10-CM | POA: Diagnosis not present

## 2017-09-15 DIAGNOSIS — E559 Vitamin D deficiency, unspecified: Secondary | ICD-10-CM | POA: Diagnosis not present

## 2018-01-24 ENCOUNTER — Ambulatory Visit: Payer: BLUE CROSS/BLUE SHIELD | Admitting: Family Medicine

## 2018-01-24 ENCOUNTER — Encounter: Payer: Self-pay | Admitting: Family Medicine

## 2018-01-24 VITALS — BP 132/80 | HR 92 | Temp 98.2°F | Resp 16 | Ht 65.0 in | Wt 200.8 lb

## 2018-01-24 DIAGNOSIS — R1032 Left lower quadrant pain: Secondary | ICD-10-CM | POA: Diagnosis not present

## 2018-01-24 LAB — POC URINALSYSI DIPSTICK (AUTOMATED)
Bilirubin, UA: NEGATIVE
GLUCOSE UA: NEGATIVE
Ketones, UA: NEGATIVE
Leukocytes, UA: NEGATIVE
NITRITE UA: NEGATIVE
Protein, UA: NEGATIVE
SPEC GRAV UA: 1.015 (ref 1.010–1.025)
UROBILINOGEN UA: 0.2 U/dL
pH, UA: 6 (ref 5.0–8.0)

## 2018-01-24 MED ORDER — AMOXICILLIN-POT CLAVULANATE 875-125 MG PO TABS: 1.0000 | ORAL_TABLET | Freq: Two times a day (BID) | ORAL | 0 refills | Status: DC

## 2018-01-24 NOTE — Patient Instructions (Signed)
Diverticulitis °Diverticulitis is infection or inflammation of small pouches (diverticula) in the colon that form due to a condition called diverticulosis. Diverticula can trap stool (feces) and bacteria, causing infection and inflammation. °Diverticulitis may cause severe stomach pain and diarrhea. It may lead to tissue damage in the colon that causes bleeding. The diverticula may also burst (rupture) and cause infected stool to enter other areas of the abdomen. °Complications of diverticulitis can include: °· Bleeding. °· Severe infection. °· Severe pain. °· Rupture (perforation) of the colon. °· Blockage (obstruction) of the colon. ° °What are the causes? °This condition is caused by stool becoming trapped in the diverticula, which allows bacteria to grow in the diverticula. This leads to inflammation and infection. °What increases the risk? °You are more likely to develop this condition if: °· You have diverticulosis. The risk for diverticulosis increases if: °? You are overweight or obese. °? You use tobacco products. °? You do not get enough exercise. °· You eat a diet that does not include enough fiber. High-fiber foods include fruits, vegetables, beans, nuts, and whole grains. ° °What are the signs or symptoms? °Symptoms of this condition may include: °· Pain and tenderness in the abdomen. The pain is normally located on the left side of the abdomen, but it may occur in other areas. °· Fever and chills. °· Bloating. °· Cramping. °· Nausea. °· Vomiting. °· Changes in bowel routines. °· Blood in your stool. ° °How is this diagnosed? °This condition is diagnosed based on: °· Your medical history. °· A physical exam. °· Tests to make sure there is nothing else causing your condition. These tests may include: °? Blood tests. °? Urine tests. °? Imaging tests of the abdomen, including X-rays, ultrasounds, MRIs, or CT scans. ° °How is this treated? °Most cases of this condition are mild and can be treated at home.  Treatment may include: °· Taking over-the-counter pain medicines. °· Following a clear liquid diet. °· Taking antibiotic medicines by mouth. °· Rest. ° °More severe cases may need to be treated at a hospital. Treatment may include: °· Not eating or drinking. °· Taking prescription pain medicine. °· Receiving antibiotic medicines through an IV tube. °· Receiving fluids and nutrition through an IV tube. °· Surgery. ° °When your condition is under control, your health care provider may recommend that you have a colonoscopy. This is an exam to look at the entire large intestine. During the exam, a lubricated, bendable tube is inserted into the anus and then passed into the rectum, colon, and other parts of the large intestine. A colonoscopy can show how severe your diverticula are and whether something else may be causing your symptoms. °Follow these instructions at home: °Medicines °· Take over-the-counter and prescription medicines only as told by your health care provider. These include fiber supplements, probiotics, and stool softeners. °· If you were prescribed an antibiotic medicine, take it as told by your health care provider. Do not stop taking the antibiotic even if you start to feel better. °· Do not drive or use heavy machinery while taking prescription pain medicine. °General instructions °· Follow a full liquid diet or another diet as directed by your health care provider. After your symptoms improve, your health care provider may tell you to change your diet. He or she may recommend that you eat a diet that contains at least 25 g (25 grams) of fiber daily. Fiber makes it easier to pass stool. Healthy sources of fiber include: °? Berries. One cup   contains 4-8 grams of fiber. °? Beans or lentils. One half cup contains 5-8 grams of fiber. °? Green vegetables. One cup contains 4 grams of fiber. °· Exercise for at least 30 minutes, 3 times each week. You should exercise hard enough to raise your heart rate and  break a sweat. °· Keep all follow-up visits as told by your health care provider. This is important. You may need a colonoscopy. °Contact a health care provider if: °· Your pain does not improve. °· You have a hard time drinking or eating food. °· Your bowel movements do not return to normal. °Get help right away if: °· Your pain gets worse. °· Your symptoms do not get better with treatment. °· Your symptoms suddenly get worse. °· You have a fever. °· You vomit more than one time. °· You have stools that are bloody, black, or tarry. °Summary °· Diverticulitis is infection or inflammation of small pouches (diverticula) in the colon that form due to a condition called diverticulosis. Diverticula can trap stool (feces) and bacteria, causing infection and inflammation. °· You are at higher risk for this condition if you have diverticulosis and you eat a diet that does not include enough fiber. °· Most cases of this condition are mild and can be treated at home. More severe cases may need to be treated at a hospital. °· When your condition is under control, your health care provider may recommend that you have an exam called a colonoscopy. This exam can show how severe your diverticula are and whether something else may be causing your symptoms. °This information is not intended to replace advice given to you by your health care provider. Make sure you discuss any questions you have with your health care provider. °Document Released: 11/11/2004 Document Revised: 03/06/2016 Document Reviewed: 03/06/2016 °Elsevier Interactive Patient Education © 2018 [employer] ° °

## 2018-01-24 NOTE — Progress Notes (Signed)
Patient ID: Diana Randall Makepeace, female    DOB: 12/16/1960  Age: 57 y.o. MRN: [id number]    Subjective:  Subjective  HPI Diana Randall Blakney presents for LLQ pain x 3 weeks   No diarrhea, constipation, no NV,  Pain is similar to when she had diverticulitis.  No fever  Review of Systems  Constitutional: Negative for appetite change, diaphoresis, fatigue and unexpected weight change.  Eyes: Negative for pain, redness and visual disturbance.  Respiratory: Negative for cough, chest tightness, shortness of breath and wheezing.   Cardiovascular: Negative for chest pain, palpitations and leg swelling.  Gastrointestinal: Positive for abdominal pain. Negative for blood in stool, constipation, diarrhea, nausea, rectal pain and vomiting.  Endocrine: Negative for cold intolerance, heat intolerance, polydipsia, polyphagia and polyuria.  Genitourinary: Negative for difficulty urinating, dysuria and frequency.  Neurological: Negative for dizziness, light-headedness, numbness and headaches.    History Past Medical History:  Diagnosis Date  . GERD (gastroesophageal reflux disease)   . Hyperlipidemia   . Hypertension     She has no past surgical history on file.   Her family history is not on file.She reports that she has never smoked. She has never used smokeless tobacco. She reports current alcohol use. She reports that she does not use drugs.  Current Outpatient Medications on File Prior to Visit  Medication Sig Dispense Refill  . amLODipine (NORVASC) 10 MG tablet Take 1 tablet (10 mg total) by mouth daily. 30 tablet 3  . atorvastatin (LIPITOR) 40 MG tablet Take 1 tablet (40 mg total) by mouth daily. 90 tablet 1  . olmesartan-hydrochlorothiazide (BENICAR HCT) 40-25 MG tablet Take 1 tablet by mouth daily. 30 tablet 2  . potassium chloride (K-DUR) 10 MEQ tablet 2 po qd 60 tablet 2   No current facility-administered medications on file prior to visit.      Objective:  Objective  Physical  Exam Vitals signs and nursing note reviewed.  Constitutional:      Appearance: She is well-developed and well-nourished.  HENT:     Head: Normocephalic and atraumatic.  Eyes:     Extraocular Movements: EOM normal.     Conjunctiva/sclera: Conjunctivae normal.  Neck:     Musculoskeletal: Normal range of motion and neck supple.     Thyroid: No thyromegaly.     Vascular: No carotid bruit or JVD.  Cardiovascular:     Rate and Rhythm: Normal rate and regular rhythm.     Heart sounds: Normal heart sounds. No murmur.  Pulmonary:     Effort: Pulmonary effort is normal. No respiratory distress.     Breath sounds: Normal breath sounds. No wheezing or rales.  Chest:     Chest wall: No tenderness.  Abdominal:     General: There is no distension.     Tenderness: There is abdominal tenderness. There is no guarding or rebound.  Musculoskeletal:        General: No edema.  Neurological:     Mental Status: She is alert and oriented to person, place, and time.  Psychiatric:        Mood and Affect: Mood and affect normal.    BP 132/80   Pulse 92   Temp 98.2 F (36.8 C) (Oral)   Resp 16   Ht 5\' 5"  (1.651 Randall)   Wt 200 lb 12.8 oz (91.1 kg)   LMP 01/21/2018   SpO2 98%   BMI 33.41 kg/Randall  Wt Readings from Last 3 Encounters:  01/24/18 200 lb 12.8 oz (  91.1 kg)  08/08/17 194 lb 6.4 oz (88.2 kg)  05/05/17 203 lb (92.1 kg)     Lab Results  Component Value Date   WBC 6.5 01/24/2018   HGB 14.5 01/24/2018   HCT 42.6 01/24/2018   PLT 365.0 01/24/2018   GLUCOSE 86 01/24/2018   CHOL 234 (H) 08/08/2017   TRIG 125.0 08/08/2017   HDL 50.60 08/08/2017   LDLDIRECT 151.9 11/06/2012   LDLCALC 158 (H) 08/08/2017   ALT 12 01/24/2018   AST 17 01/24/2018   NA 138 01/24/2018   K 3.5 01/24/2018   CL 97 01/24/2018   CREATININE 0.86 01/24/2018   BUN 14 01/24/2018   CO2 33 (H) 01/24/2018   TSH 1.35 04/22/2014   MICROALBUR 1.2 11/06/2012    Dg Chest 2 View  Result Date: 05/05/2017 CLINICAL DATA:   Dry cough, shortness of breath, wheezing and fever worse since yesterday, body aches, history hypertension EXAM: CHEST - 2 VIEW COMPARISON:  None FINDINGS: Normal heart size, mediastinal contours, and pulmonary vascularity. Lungs clear. No pleural effusion or pneumothorax. Bones unremarkable. IMPRESSION: Normal exam. Electronically Signed   By: Ulyses Southward Randall.D.   On: 05/05/2017 16:45     Assessment & Plan:  Plan  I have discontinued Mithra Spano. Lupton's diclofenac and oseltamivir. I am also having her start on amoxicillin-clavulanate. Additionally, I am having her maintain her olmesartan-hydrochlorothiazide, atorvastatin, potassium chloride, and amLODipine.  Meds ordered this encounter  Medications  . amoxicillin-clavulanate (AUGMENTIN) 875-125 MG tablet    Sig: Take 1 tablet by mouth 2 (two) times daily.    Dispense:  20 tablet    Refill:  0    Problem List Items Addressed This Visit    None    Visit Diagnoses    Left lower quadrant abdominal pain    -  Primary   Relevant Medications   amoxicillin-clavulanate (AUGMENTIN) 875-125 MG tablet   Other Relevant Orders   POCT Urinalysis Dipstick (Automated) (Completed)   CBC with Differential/Platelet (Completed)   Comprehensive metabolic panel (Completed)   Urine Culture (Completed)    if pain does not improve consider CT abd/ pelvis  Follow-up: Return if symptoms worsen or fail to improve.  [name], DO

## 2018-01-25 LAB — COMPREHENSIVE METABOLIC PANEL
ALT: 12 U/L (ref 0–35)
AST: 17 U/L (ref 0–37)
Albumin: 4.2 g/dL (ref 3.5–5.2)
Alkaline Phosphatase: 86 U/L (ref 39–117)
BUN: 14 mg/dL (ref 6–23)
CO2: 33 mEq/L — ABNORMAL HIGH (ref 19–32)
Calcium: 9.6 mg/dL (ref 8.4–10.5)
Chloride: 97 mEq/L (ref 96–112)
Creatinine, Ser: 0.86 mg/dL (ref 0.40–1.20)
GFR: 87.18 mL/min (ref >60.00)
Glucose, Bld: 86 mg/dL (ref 70–99)
Potassium: 3.5 mEq/L (ref 3.5–5.1)
Sodium: 138 mEq/L (ref 135–145)
TOTAL PROTEIN: 7.1 g/dL (ref 6.0–8.3)
Total Bilirubin: 0.6 mg/dL (ref 0.2–1.2)

## 2018-01-25 LAB — CBC WITH DIFFERENTIAL/PLATELET
BASOS ABS: 0.1 10*3/uL (ref 0.0–0.1)
Basophils Relative: 1.1 % (ref 0.0–3.0)
EOS PCT: 1.9 % (ref 0.0–5.0)
Eosinophils Absolute: 0.1 10*3/uL (ref 0.0–0.7)
HCT: 42.6 % (ref 36.0–46.0)
HEMOGLOBIN: 14.5 g/dL (ref 12.0–15.0)
LYMPHS PCT: 42.7 % (ref 12.0–46.0)
Lymphs Abs: 2.8 10*3/uL (ref 0.7–4.0)
MCHC: 34.2 g/dL (ref 30.0–36.0)
MCV: 91 fl (ref 78.0–100.0)
MONO ABS: 0.3 10*3/uL (ref 0.1–1.0)
Monocytes Relative: 4.2 % (ref 3.0–12.0)
NEUTROS ABS: 3.3 10*3/uL (ref 1.4–7.7)
Neutrophils Relative %: 50.1 % (ref 43.0–77.0)
Platelets: 365 10*3/uL (ref 150.0–400.0)
RBC: 4.68 Mil/uL (ref 3.87–5.11)
RDW: 13.2 % (ref 11.5–15.5)
WBC: 6.5 10*3/uL (ref 4.0–10.5)

## 2018-01-25 LAB — URINE CULTURE
MICRO NUMBER: 91477460
Result:: NO GROWTH
SPECIMEN QUALITY:: ADEQUATE

## 2018-02-21 ENCOUNTER — Other Ambulatory Visit: Payer: Self-pay | Admitting: Family Medicine

## 2018-02-21 DIAGNOSIS — I10 Essential (primary) hypertension: Secondary | ICD-10-CM

## 2018-02-23 DIAGNOSIS — M5412 Radiculopathy, cervical region: Secondary | ICD-10-CM | POA: Diagnosis not present

## 2018-02-23 DIAGNOSIS — R51 Headache: Secondary | ICD-10-CM | POA: Diagnosis not present

## 2018-08-08 DIAGNOSIS — Z1231 Encounter for screening mammogram for malignant neoplasm of breast: Secondary | ICD-10-CM | POA: Diagnosis not present

## 2018-08-08 DIAGNOSIS — Z803 Family history of malignant neoplasm of breast: Secondary | ICD-10-CM | POA: Diagnosis not present

## 2018-08-08 LAB — HM MAMMOGRAPHY

## 2018-10-26 DIAGNOSIS — Z01419 Encounter for gynecological examination (general) (routine) without abnormal findings: Secondary | ICD-10-CM | POA: Diagnosis not present

## 2018-10-26 DIAGNOSIS — Z6834 Body mass index (BMI) 34.0-34.9, adult: Secondary | ICD-10-CM | POA: Diagnosis not present

## 2018-10-26 DIAGNOSIS — N951 Menopausal and female climacteric states: Secondary | ICD-10-CM | POA: Diagnosis not present

## 2019-01-16 ENCOUNTER — Other Ambulatory Visit: Payer: Self-pay

## 2019-01-16 DIAGNOSIS — Z20822 Contact with and (suspected) exposure to covid-19: Secondary | ICD-10-CM

## 2019-01-18 LAB — NOVEL CORONAVIRUS, NAA: SARS-CoV-2, NAA: DETECTED — AB

## 2019-01-19 ENCOUNTER — Encounter: Payer: Self-pay | Admitting: Family Medicine

## 2019-01-19 NOTE — Telephone Encounter (Signed)
Quarantine at home--- do not go anywhere and if at all possible -- no one should come to the house If she get very sob she should go to ER Quarantine for at least 10 days-- she needs to be 72 without a fever (an on no meds for fever)

## 2019-01-31 DIAGNOSIS — M722 Plantar fascial fibromatosis: Secondary | ICD-10-CM | POA: Diagnosis not present

## 2019-01-31 DIAGNOSIS — M84374A Stress fracture, right foot, initial encounter for fracture: Secondary | ICD-10-CM | POA: Diagnosis not present

## 2019-02-22 DIAGNOSIS — M84374A Stress fracture, right foot, initial encounter for fracture: Secondary | ICD-10-CM | POA: Diagnosis not present

## 2019-02-22 DIAGNOSIS — M722 Plantar fascial fibromatosis: Secondary | ICD-10-CM | POA: Diagnosis not present

## 2019-04-25 ENCOUNTER — Other Ambulatory Visit: Payer: Self-pay

## 2019-04-26 ENCOUNTER — Other Ambulatory Visit: Payer: Self-pay

## 2019-04-26 ENCOUNTER — Encounter: Payer: Self-pay | Admitting: Family Medicine

## 2019-04-26 ENCOUNTER — Ambulatory Visit (INDEPENDENT_AMBULATORY_CARE_PROVIDER_SITE_OTHER): Payer: BC Managed Care – PPO | Admitting: Family Medicine

## 2019-04-26 VITALS — BP 120/82 | HR 85 | Temp 97.4°F | Resp 18 | Ht 65.0 in | Wt 208.4 lb

## 2019-04-26 DIAGNOSIS — K219 Gastro-esophageal reflux disease without esophagitis: Secondary | ICD-10-CM

## 2019-04-26 DIAGNOSIS — E785 Hyperlipidemia, unspecified: Secondary | ICD-10-CM | POA: Diagnosis not present

## 2019-04-26 DIAGNOSIS — G4700 Insomnia, unspecified: Secondary | ICD-10-CM

## 2019-04-26 DIAGNOSIS — I10 Essential (primary) hypertension: Secondary | ICD-10-CM

## 2019-04-26 DIAGNOSIS — L659 Nonscarring hair loss, unspecified: Secondary | ICD-10-CM

## 2019-04-26 DIAGNOSIS — E876 Hypokalemia: Secondary | ICD-10-CM

## 2019-04-26 DIAGNOSIS — G250 Essential tremor: Secondary | ICD-10-CM | POA: Diagnosis not present

## 2019-04-26 DIAGNOSIS — Z8616 Personal history of COVID-19: Secondary | ICD-10-CM

## 2019-04-26 MED ORDER — POTASSIUM CHLORIDE ER 10 MEQ PO TBCR: EXTENDED_RELEASE_TABLET | ORAL | 1 refills | Status: DC

## 2019-04-26 MED ORDER — TRAZODONE HCL 50 MG PO TABS: 25.0000 mg | ORAL_TABLET | Freq: Every evening | ORAL | 0 refills | Status: DC | PRN

## 2019-04-26 MED ORDER — ATORVASTATIN CALCIUM 40 MG PO TABS: 40.0000 mg | ORAL_TABLET | Freq: Every day | ORAL | 1 refills | Status: DC

## 2019-04-26 MED ORDER — PANTOPRAZOLE SODIUM 40 MG PO TBEC: 40.0000 mg | DELAYED_RELEASE_TABLET | Freq: Every day | ORAL | 3 refills | Status: DC

## 2019-04-26 MED ORDER — AMLODIPINE BESYLATE 10 MG PO TABS: 10.0000 mg | ORAL_TABLET | Freq: Every day | ORAL | 1 refills | Status: DC

## 2019-04-26 MED ORDER — OLMESARTAN MEDOXOMIL-HCTZ 40-25 MG PO TABS: 1.0000 | ORAL_TABLET | Freq: Every day | ORAL | 1 refills | Status: DC

## 2019-04-26 NOTE — Progress Notes (Signed)
Patient ID: Diana Randall, female    DOB: 30-Oct-1960  Age: 59 y.o. MRN: [id number]    Subjective:  Subjective  HPI NEDA WILLENBRING presents for f/u bp and chol and c/o tremor in both hands separately no other complaints Review of Systems  Constitutional: Negative for appetite change, diaphoresis, fatigue and unexpected weight change.  Eyes: Negative for pain, redness and visual disturbance.  Respiratory: Negative for cough, chest tightness, shortness of breath and wheezing.   Cardiovascular: Negative for chest pain, palpitations and leg swelling.  Gastrointestinal: Positive for abdominal pain.  Endocrine: Negative for cold intolerance, heat intolerance, polydipsia, polyphagia and polyuria.  Genitourinary: Negative for difficulty urinating, dysuria and frequency.  Neurological: Positive for tremors. Negative for dizziness, light-headedness, numbness and headaches.    History Past Medical History:  Diagnosis Date  . GERD (gastroesophageal reflux disease)   . Hyperlipidemia   . Hypertension     She has no past surgical history on file.   Her family history includes Stomach cancer in her brother.She reports that she has never smoked. She has never used smokeless tobacco. She reports current alcohol use. She reports that she does not use drugs.  No current outpatient medications on file prior to visit.   No current facility-administered medications on file prior to visit.     Objective:  Objective  Physical Exam Vitals and nursing note reviewed.  Constitutional:      Appearance: She is well-developed.  HENT:     Head: Normocephalic and atraumatic.  Eyes:     Conjunctiva/sclera: Conjunctivae normal.  Neck:     Thyroid: No thyromegaly.     Vascular: No carotid bruit or JVD.  Cardiovascular:     Rate and Rhythm: Normal rate and regular rhythm.     Heart sounds: Normal heart sounds. No murmur.  Pulmonary:     Effort: Pulmonary effort is normal. No respiratory distress.      Breath sounds: Normal breath sounds. No wheezing or rales.  Chest:     Chest wall: No tenderness.  Musculoskeletal:     Cervical back: Normal range of motion and neck supple.  Neurological:     Mental Status: She is alert and oriented to person, place, and time.     Motor: Tremor present.    BP 120/82 (BP Location: Right Arm, Patient Position: Sitting, Cuff Size: Large)   Pulse 85   Temp (!) 97.4 F (36.3 C) (Temporal)   Resp 18   Ht 5\' 5"  (1.651 m)   Wt 208 lb 6.4 oz (94.5 kg)   SpO2 99%   BMI 34.68 kg/m  Wt Readings from Last 3 Encounters:  04/26/19 208 lb 6.4 oz (94.5 kg)  01/24/18 200 lb 12.8 oz (91.1 kg)  08/08/17 194 lb 6.4 oz (88.2 kg)     Lab Results  Component Value Date   WBC 6.5 01/24/2018   HGB 14.5 01/24/2018   HCT 42.6 01/24/2018   PLT 365.0 01/24/2018   GLUCOSE 82 04/26/2019   CHOL 269 (H) 04/26/2019   TRIG 101.0 04/26/2019   HDL 59.40 04/26/2019   LDLDIRECT 151.9 11/06/2012   LDLCALC 189 (H) 04/26/2019   ALT 16 04/26/2019   AST 18 04/26/2019   NA 138 04/26/2019   K 3.3 (L) 04/26/2019   CL 99 04/26/2019   CREATININE 0.69 04/26/2019   BUN 13 04/26/2019   CO2 30 04/26/2019   TSH 1.61 04/26/2019   MICROALBUR 1.2 11/06/2012    DG Chest 2 View  Result  Date: 05/05/2017 CLINICAL DATA:  Dry cough, shortness of breath, wheezing and fever worse since yesterday, body aches, history hypertension EXAM: CHEST - 2 VIEW COMPARISON:  None FINDINGS: Normal heart size, mediastinal contours, and pulmonary vascularity. Lungs clear. No pleural effusion or pneumothorax. Bones unremarkable. IMPRESSION: Normal exam. Electronically Signed   By: Lavonia Dana M.D.   On: 05/05/2017 16:45     Assessment & Plan:  Plan  I have discontinued Chaia Ikard. Carrell "Jackie"'s amoxicillin-clavulanate. I have also changed her potassium chloride. Additionally, I am having her start on pantoprazole and traZODone. Lastly, I am having her maintain her amLODipine, atorvastatin, and  olmesartan-hydrochlorothiazide.  Meds ordered this encounter  Medications  . amLODipine (NORVASC) 10 MG tablet    Sig: Take 1 tablet (10 mg total) by mouth daily.    Dispense:  90 tablet    Refill:  1  . atorvastatin (LIPITOR) 40 MG tablet    Sig: Take 1 tablet (40 mg total) by mouth daily.    Dispense:  90 tablet    Refill:  1  . olmesartan-hydrochlorothiazide (BENICAR HCT) 40-25 MG tablet    Sig: Take 1 tablet by mouth daily.    Dispense:  90 tablet    Refill:  1  . potassium chloride (KLOR-CON) 10 MEQ tablet    Sig: 2 po qd    Dispense:  180 tablet    Refill:  1  . pantoprazole (PROTONIX) 40 MG tablet    Sig: Take 1 tablet (40 mg total) by mouth daily.    Dispense:  30 tablet    Refill:  3  . traZODone (DESYREL) 50 MG tablet    Sig: Take 0.5-1 tablets (25-50 mg total) by mouth at bedtime as needed for sleep.    Dispense:  30 tablet    Refill:  0    Problem List Items Addressed This Visit      Unprioritized   Essential hypertension    Well controlled, no changes to meds. Encouraged heart healthy diet such as the DASH diet and exercise as tolerated.       Relevant Medications   amLODipine (NORVASC) 10 MG tablet   atorvastatin (LIPITOR) 40 MG tablet   olmesartan-hydrochlorothiazide (BENICAR HCT) 40-25 MG tablet   Other Relevant Orders   Lipid panel (Completed)   Comprehensive metabolic panel (Completed)   Essential tremor - Primary    Check labs Consider neuro referral      Relevant Orders   Vitamin B12 (Completed)   Gastroesophageal reflux disease   Relevant Medications   pantoprazole (PROTONIX) 40 MG tablet   Hair loss   Relevant Orders   Thyroid Panel With TSH (Completed)   History of COVID-19   Relevant Orders   SARS-COV-2 IgG (Completed)   Hyperlipidemia    Tolerating statin, encouraged heart healthy diet, avoid trans fats, minimize simple carbs and saturated fats. Increase exercise as tolerated      Relevant Medications   amLODipine (NORVASC) 10  MG tablet   atorvastatin (LIPITOR) 40 MG tablet   olmesartan-hydrochlorothiazide (BENICAR HCT) 40-25 MG tablet   Other Relevant Orders   Lipid panel (Completed)   Comprehensive metabolic panel (Completed)   Hypokalemia   Relevant Medications   potassium chloride (KLOR-CON) 10 MEQ tablet    Other Visit Diagnoses    Morbid obesity ([place])       Relevant Orders   Amb Ref to Medical Weight Management   Insomnia, unspecified type       Relevant Medications  traZODone (DESYREL) 50 MG tablet      Follow-up: Return in about 6 months (around 10/27/2019), or if symptoms worsen or fail to improve, for annual exam, fasting.  [name], DO

## 2019-04-26 NOTE — Patient Instructions (Signed)
DASH Eating Plan DASH stands for "Dietary Approaches to Stop Hypertension." The DASH eating plan is a healthy eating plan that has been shown to reduce high blood pressure (hypertension). It may also reduce your risk for type 2 diabetes, heart disease, and stroke. The DASH eating plan may also help with weight loss. What are tips for following this plan?  General guidelines  Avoid eating more than 2,300 mg (milligrams) of salt (sodium) a day. If you have hypertension, you may need to reduce your sodium intake to 1,500 mg a day.  Limit alcohol intake to no more than 1 drink a day for nonpregnant women and 2 drinks a day for men. One drink equals 12 oz of beer, 5 oz of wine, or 1 oz of hard liquor.  Work with your health care provider to maintain a healthy body weight or to lose weight. Ask what an ideal weight is for you.  Get at least 30 minutes of exercise that causes your heart to beat faster (aerobic exercise) most days of the week. Activities may include walking, swimming, or biking.  Work with your health care provider or diet and nutrition specialist (dietitian) to adjust your eating plan to your individual calorie needs. Reading food labels   Check food labels for the amount of sodium per serving. Choose foods with less than 5 percent of the Daily Value of sodium. Generally, foods with less than 300 mg of sodium per serving fit into this eating plan.  To find whole grains, look for the word "whole" as the first word in the ingredient list. Shopping  Buy products labeled as "low-sodium" or "no salt added."  Buy fresh foods. Avoid canned foods and premade or frozen meals. Cooking  Avoid adding salt when cooking. Use salt-free seasonings or herbs instead of table salt or sea salt. Check with your health care provider or pharmacist before using salt substitutes.  Do not fry foods. Cook foods using healthy methods such as baking, boiling, grilling, and broiling instead.  Cook with  heart-healthy oils, such as olive, canola, soybean, or sunflower oil. Meal planning  Eat a balanced diet that includes: ? 5 or more servings of fruits and vegetables each day. At each meal, try to fill half of your plate with fruits and vegetables. ? Up to 6-8 servings of whole grains each day. ? Less than 6 oz of lean meat, poultry, or fish each day. A 3-oz serving of meat is about the same size as a deck of cards. One egg equals 1 oz. ? 2 servings of low-fat dairy each day. ? A serving of nuts, seeds, or beans 5 times each week. ? Heart-healthy fats. Healthy fats called Omega-3 fatty acids are found in foods such as flaxseeds and coldwater fish, like sardines, salmon, and mackerel.  Limit how much you eat of the following: ? Canned or prepackaged foods. ? Food that is high in trans fat, such as fried foods. ? Food that is high in saturated fat, such as fatty meat. ? Sweets, desserts, sugary drinks, and other foods with added sugar. ? Full-fat dairy products.  Do not salt foods before eating.  Try to eat at least 2 vegetarian meals each week.  Eat more home-cooked food and less restaurant, buffet, and fast food.  When eating at a restaurant, ask that your food be prepared with less salt or no salt, if possible. What foods are recommended? The items listed may not be a complete list. Talk with your dietitian about   what dietary choices are best for you. Grains Whole-grain or whole-wheat bread. Whole-grain or whole-wheat pasta. Brown rice. Oatmeal. Quinoa. Bulgur. Whole-grain and low-sodium cereals. Pita bread. Low-fat, low-sodium crackers. Whole-wheat flour tortillas. Vegetables Fresh or frozen vegetables (raw, steamed, roasted, or grilled). Low-sodium or reduced-sodium tomato and vegetable juice. Low-sodium or reduced-sodium tomato sauce and tomato paste. Low-sodium or reduced-sodium canned vegetables. Fruits All fresh, dried, or frozen fruit. Canned fruit in natural juice (without  added sugar). Meat and other protein foods Skinless chicken or turkey. Ground chicken or turkey. Pork with fat trimmed off. Fish and seafood. Egg whites. Dried beans, peas, or lentils. Unsalted nuts, nut butters, and seeds. Unsalted canned beans. Lean cuts of beef with fat trimmed off. Low-sodium, lean deli meat. Dairy Low-fat (1%) or fat-free (skim) milk. Fat-free, low-fat, or reduced-fat cheeses. Nonfat, low-sodium ricotta or cottage cheese. Low-fat or nonfat yogurt. Low-fat, low-sodium cheese. Fats and oils Soft margarine without trans fats. Vegetable oil. Low-fat, reduced-fat, or light mayonnaise and salad dressings (reduced-sodium). Canola, safflower, olive, soybean, and sunflower oils. Avocado. Seasoning and other foods Herbs. Spices. Seasoning mixes without salt. Unsalted popcorn and pretzels. Fat-free sweets. What foods are not recommended? The items listed may not be a complete list. Talk with your dietitian about what dietary choices are best for you. Grains Baked goods made with fat, such as croissants, muffins, or some breads. Dry pasta or rice meal packs. Vegetables Creamed or fried vegetables. Vegetables in a cheese sauce. Regular canned vegetables (not low-sodium or reduced-sodium). Regular canned tomato sauce and paste (not low-sodium or reduced-sodium). Regular tomato and vegetable juice (not low-sodium or reduced-sodium). Pickles. Olives. Fruits Canned fruit in a light or heavy syrup. Fried fruit. Fruit in cream or butter sauce. Meat and other protein foods Fatty cuts of meat. Ribs. Fried meat. Bacon. Sausage. Bologna and other processed lunch meats. Salami. Fatback. Hotdogs. Bratwurst. Salted nuts and seeds. Canned beans with added salt. Canned or smoked fish. Whole eggs or egg yolks. Chicken or turkey with skin. Dairy Whole or 2% milk, cream, and half-and-half. Whole or full-fat cream cheese. Whole-fat or sweetened yogurt. Full-fat cheese. Nondairy creamers. Whipped toppings.  Processed cheese and cheese spreads. Fats and oils Butter. Stick margarine. Lard. Shortening. Ghee. Bacon fat. Tropical oils, such as coconut, palm kernel, or palm oil. Seasoning and other foods Salted popcorn and pretzels. Onion salt, garlic salt, seasoned salt, table salt, and sea salt. Worcestershire sauce. Tartar sauce. Barbecue sauce. Teriyaki sauce. Soy sauce, including reduced-sodium. Steak sauce. Canned and packaged gravies. Fish sauce. Oyster sauce. Cocktail sauce. Horseradish that you find on the shelf. Ketchup. Mustard. Meat flavorings and tenderizers. Bouillon cubes. Hot sauce and Tabasco sauce. Premade or packaged marinades. Premade or packaged taco seasonings. Relishes. Regular salad dressings. Where to find more information:  National Heart, Lung, and Blood Institute: www.nhlbi.nih.gov  American Heart Association: www.heart.org Summary  The DASH eating plan is a healthy eating plan that has been shown to reduce high blood pressure (hypertension). It may also reduce your risk for type 2 diabetes, heart disease, and stroke.  With the DASH eating plan, you should limit salt (sodium) intake to 2,300 mg a day. If you have hypertension, you may need to reduce your sodium intake to 1,500 mg a day.  When on the DASH eating plan, aim to eat more fresh fruits and vegetables, whole grains, lean proteins, low-fat dairy, and heart-healthy fats.  Work with your health care provider or diet and nutrition specialist (dietitian) to adjust your eating plan to your   individual calorie needs. This information is not intended to replace advice given to you by your health care provider. Make sure you discuss any questions you have with your health care provider. Document Revised: 01/14/2017 Document Reviewed: 01/26/2016 Elsevier Patient Education  2020 [employer] Inc.  

## 2019-04-27 DIAGNOSIS — G250 Essential tremor: Secondary | ICD-10-CM | POA: Insufficient documentation

## 2019-04-27 DIAGNOSIS — L659 Nonscarring hair loss, unspecified: Secondary | ICD-10-CM | POA: Insufficient documentation

## 2019-04-27 DIAGNOSIS — K219 Gastro-esophageal reflux disease without esophagitis: Secondary | ICD-10-CM | POA: Insufficient documentation

## 2019-04-27 DIAGNOSIS — E876 Hypokalemia: Secondary | ICD-10-CM | POA: Insufficient documentation

## 2019-04-27 DIAGNOSIS — Z8616 Personal history of COVID-19: Secondary | ICD-10-CM | POA: Insufficient documentation

## 2019-04-27 LAB — VITAMIN B12: Vitamin B-12: 220 pg/mL (ref 211–911)

## 2019-04-27 LAB — COMPREHENSIVE METABOLIC PANEL
ALT: 16 U/L (ref 0–35)
AST: 18 U/L (ref 0–37)
Albumin: 4 g/dL (ref 3.5–5.2)
Alkaline Phosphatase: 97 U/L (ref 39–117)
BUN: 13 mg/dL (ref 6–23)
CO2: 30 mEq/L (ref 19–32)
Calcium: 9.2 mg/dL (ref 8.4–10.5)
Chloride: 99 mEq/L (ref 96–112)
Creatinine, Ser: 0.69 mg/dL (ref 0.40–1.20)
GFR: 105.3 mL/min (ref >60.00)
Glucose, Bld: 82 mg/dL (ref 70–99)
Potassium: 3.3 mEq/L — ABNORMAL LOW (ref 3.5–5.1)
Sodium: 138 mEq/L (ref 135–145)
Total Bilirubin: 0.7 mg/dL (ref 0.2–1.2)
Total Protein: 6.9 g/dL (ref 6.0–8.3)

## 2019-04-27 LAB — LIPID PANEL
Cholesterol: 269 mg/dL — ABNORMAL HIGH (ref 0–200)
HDL: 59.4 mg/dL (ref >39.00)
LDL Cholesterol: 189 mg/dL — ABNORMAL HIGH (ref 0–99)
NonHDL: 209.1
Total CHOL/HDL Ratio: 5
Triglycerides: 101 mg/dL (ref 0.0–149.0)
VLDL: 20.2 mg/dL (ref 0.0–40.0)

## 2019-04-27 LAB — THYROID PANEL WITH TSH
Free Thyroxine Index: 1.9 (ref 1.4–3.8)
T3 Uptake: 30 % (ref 22–35)
T4, Total: 6.4 ug/dL (ref 5.1–11.9)
TSH: 1.61 mIU/L (ref 0.40–4.50)

## 2019-04-27 LAB — SARS-COV-2 IGG: SARS-COV-2 IgG: 3.78

## 2019-04-27 NOTE — Assessment & Plan Note (Signed)
Check labs  Consider neuro referral 

## 2019-04-27 NOTE — Assessment & Plan Note (Signed)
Tolerating statin, encouraged heart healthy diet, avoid trans fats, minimize simple carbs and saturated fats. Increase exercise as tolerated 

## 2019-04-27 NOTE — Assessment & Plan Note (Signed)
Well controlled, no changes to meds. Encouraged heart healthy diet such as the DASH diet and exercise as tolerated.  °

## 2019-05-01 ENCOUNTER — Other Ambulatory Visit: Payer: Self-pay | Admitting: Family Medicine

## 2019-05-01 DIAGNOSIS — E785 Hyperlipidemia, unspecified: Secondary | ICD-10-CM

## 2019-05-04 MED ORDER — POTASSIUM CHLORIDE ER 10 MEQ PO TBCR: 10.0000 meq | EXTENDED_RELEASE_TABLET | Freq: Three times a day (TID) | ORAL | 2 refills | Status: DC

## 2019-05-04 MED ORDER — ROSUVASTATIN CALCIUM 20 MG PO TABS: 20.0000 mg | ORAL_TABLET | Freq: Every day | ORAL | 3 refills | Status: DC

## 2019-05-04 NOTE — Addendum Note (Signed)
Addended by: Steve Rattler A on: 05/04/2019 10:32 AM   Modules accepted: Orders

## 2019-08-09 DIAGNOSIS — Z1231 Encounter for screening mammogram for malignant neoplasm of breast: Secondary | ICD-10-CM | POA: Diagnosis not present

## 2019-08-09 LAB — HM MAMMOGRAPHY

## 2019-08-13 ENCOUNTER — Encounter: Payer: Self-pay | Admitting: Family Medicine

## 2019-08-13 DIAGNOSIS — K219 Gastro-esophageal reflux disease without esophagitis: Secondary | ICD-10-CM

## 2019-08-16 ENCOUNTER — Ambulatory Visit: Payer: BC Managed Care – PPO | Admitting: Podiatry

## 2019-08-16 ENCOUNTER — Other Ambulatory Visit: Payer: Self-pay

## 2019-08-16 ENCOUNTER — Encounter: Payer: Self-pay | Admitting: Podiatry

## 2019-08-16 ENCOUNTER — Ambulatory Visit (INDEPENDENT_AMBULATORY_CARE_PROVIDER_SITE_OTHER): Payer: BC Managed Care – PPO

## 2019-08-16 VITALS — Temp 97.7°F

## 2019-08-16 DIAGNOSIS — S9001XA Contusion of right ankle, initial encounter: Secondary | ICD-10-CM

## 2019-08-17 NOTE — Progress Notes (Signed)
Subjective:   Patient ID: Diana Randall, female   DOB: 59 y.o.   MRN: [id number]   HPI Patient states she severely twisted her right ankle a week ago it is remain tender and she is used ice and tried a boot but that is hard for her to wear and states it feels unstable.  Does not remember any anything else from this particular incident   ROS      Objective:  Physical Exam  Neurovascular status intact negative Denna Haggard' sign noted edema in the right lateral ankle with quite a bit of pain around the ankle itself around the anterior talofibular calcaneofibular ligaments and on the fibular malleolus     Assessment:  Sprain right ankle with inflammation     Plan:  H&P reviewed condition and discussed ice therapy rest compression elevation and I did dispense a Tri-Lock ankle brace to provide complete support of the right ankle in 3 planes.  Patient will be seen back to recheck in about 3 weeks or if symptoms do not start to improve and may ultimately require ankle repair  X-rays were negative for signs of fracture or bony injury associated with this condition

## 2019-09-11 DIAGNOSIS — Z8379 Family history of other diseases of the digestive system: Secondary | ICD-10-CM | POA: Diagnosis not present

## 2019-09-11 DIAGNOSIS — Z791 Long term (current) use of non-steroidal anti-inflammatories (NSAID): Secondary | ICD-10-CM | POA: Diagnosis not present

## 2019-09-11 DIAGNOSIS — Z7982 Long term (current) use of aspirin: Secondary | ICD-10-CM | POA: Diagnosis not present

## 2019-09-11 DIAGNOSIS — K219 Gastro-esophageal reflux disease without esophagitis: Secondary | ICD-10-CM | POA: Diagnosis not present

## 2019-10-25 DIAGNOSIS — K2100 Gastro-esophageal reflux disease with esophagitis, without bleeding: Secondary | ICD-10-CM | POA: Diagnosis not present

## 2019-10-25 DIAGNOSIS — H219 Unspecified disorder of iris and ciliary body: Secondary | ICD-10-CM | POA: Diagnosis not present

## 2019-10-25 DIAGNOSIS — K208 Other esophagitis without bleeding: Secondary | ICD-10-CM | POA: Diagnosis not present

## 2019-10-25 DIAGNOSIS — R6881 Early satiety: Secondary | ICD-10-CM | POA: Diagnosis not present

## 2019-10-25 DIAGNOSIS — R1013 Epigastric pain: Secondary | ICD-10-CM | POA: Diagnosis not present

## 2019-10-25 DIAGNOSIS — Z800 Family history of malignant neoplasm of digestive organs: Secondary | ICD-10-CM | POA: Diagnosis not present

## 2019-10-25 DIAGNOSIS — K219 Gastro-esophageal reflux disease without esophagitis: Secondary | ICD-10-CM | POA: Diagnosis not present

## 2019-12-17 DIAGNOSIS — Z6834 Body mass index (BMI) 34.0-34.9, adult: Secondary | ICD-10-CM | POA: Diagnosis not present

## 2019-12-17 DIAGNOSIS — Z01419 Encounter for gynecological examination (general) (routine) without abnormal findings: Secondary | ICD-10-CM | POA: Diagnosis not present

## 2019-12-20 DIAGNOSIS — Z20822 Contact with and (suspected) exposure to covid-19: Secondary | ICD-10-CM | POA: Diagnosis not present

## 2020-01-24 DIAGNOSIS — K209 Esophagitis, unspecified without bleeding: Secondary | ICD-10-CM | POA: Diagnosis not present

## 2020-01-24 DIAGNOSIS — K219 Gastro-esophageal reflux disease without esophagitis: Secondary | ICD-10-CM | POA: Diagnosis not present

## 2020-01-24 DIAGNOSIS — K449 Diaphragmatic hernia without obstruction or gangrene: Secondary | ICD-10-CM | POA: Diagnosis not present

## 2020-01-24 DIAGNOSIS — K225 Diverticulum of esophagus, acquired: Secondary | ICD-10-CM | POA: Diagnosis not present

## 2020-02-01 DIAGNOSIS — Z20822 Contact with and (suspected) exposure to covid-19: Secondary | ICD-10-CM | POA: Diagnosis not present

## 2020-02-01 DIAGNOSIS — Z03818 Encounter for observation for suspected exposure to other biological agents ruled out: Secondary | ICD-10-CM | POA: Diagnosis not present

## 2020-02-11 ENCOUNTER — Telehealth: Payer: Self-pay | Admitting: Medical

## 2020-02-11 ENCOUNTER — Ambulatory Visit: Payer: BC Managed Care – PPO | Admitting: Medical

## 2020-02-11 ENCOUNTER — Other Ambulatory Visit: Payer: Self-pay

## 2020-02-11 VITALS — BP 126/70 | HR 78 | Resp 18 | Ht 65.0 in | Wt 212.4 lb

## 2020-02-11 DIAGNOSIS — Z1152 Encounter for screening for COVID-19: Secondary | ICD-10-CM

## 2020-02-11 DIAGNOSIS — R109 Unspecified abdominal pain: Secondary | ICD-10-CM | POA: Diagnosis not present

## 2020-02-11 MED ORDER — METRONIDAZOLE 500 MG PO TABS: 500.0000 mg | ORAL_TABLET | Freq: Three times a day (TID) | ORAL | 0 refills | Status: DC

## 2020-02-11 MED ORDER — CIPROFLOXACIN HCL 500 MG PO TABS: 500.0000 mg | ORAL_TABLET | Freq: Two times a day (BID) | ORAL | 0 refills | Status: DC

## 2020-02-11 NOTE — Progress Notes (Signed)
Subjective:    Patient ID: Diana Randall, female    DOB: 1961/01/02, 59 y.o.   MRN: [id number]  HPI  Pt in with some left side abdomen pain for about 5-6 days. Pt has hx of diverticulitis and she brings this up as a possibility/thinks maybe occuring. Pt states feels similar. No diarrhea.  Pt states worse pain on 24 and 25.  No vomiting and normal appetite.  Pt heading out of town on Monday. Traveling to [place].   Pt got covid last year. Pt vaccinate and got booster in December 31, 2019.     Review of Systems  Constitutional: Negative for chills, fatigue and fever.  Respiratory: Negative for cough, chest tightness, shortness of breath and wheezing.   Cardiovascular: Negative for chest pain and palpitations.  Gastrointestinal: Positive for abdominal pain. Negative for abdominal distention, blood in stool, constipation, diarrhea, nausea and vomiting.  Musculoskeletal: Negative for back pain.  Skin: Negative for rash.  Neurological: Negative for dizziness, seizures, speech difficulty, weakness and light-headedness.  Hematological: Negative for adenopathy. Does not bruise/bleed easily.  Psychiatric/Behavioral: Negative for behavioral problems, confusion and suicidal ideas. The patient is not nervous/anxious.     Past Medical History:  Diagnosis Date   GERD (gastroesophageal reflux disease)    Hyperlipidemia    Hypertension      Social History   Socioeconomic History   Marital status: Married    Spouse name: Not on file   Number of children: Not on file   Years of education: Not on file   Highest education level: Not on file  Occupational History   Not on file  Tobacco Use   Smoking status: Never Smoker   Smokeless tobacco: Never Used  Substance and Sexual Activity   Alcohol use: Yes   Drug use: No   Sexual activity: Not Currently    Partners: Male  Other Topics Concern   Not on file  Social History Narrative   Not on file   Social Determinants  of Health   Financial Resource Strain: Not on file  Food Insecurity: Not on file  Transportation Needs: Not on file  Physical Activity: Not on file  Stress: Not on file  Social Connections: Not on file  Intimate Partner Violence: Not on file    No past surgical history on file.  Family History  Problem Relation Age of Onset   Stomach cancer Brother     Allergies  Allergen Reactions   Latex Rash    Current Outpatient Medications on File Prior to Visit  Medication Sig Dispense Refill   amLODipine (NORVASC) 10 MG tablet Take 1 tablet (10 mg total) by mouth daily. 90 tablet 1   atorvastatin (LIPITOR) 40 MG tablet Lipitor 40 mg tablet  Take 1 tablet every day by oral route.     olmesartan-hydrochlorothiazide (BENICAR HCT) 40-25 MG tablet Take 1 tablet by mouth daily. 90 tablet 1   pantoprazole (PROTONIX) 40 MG tablet Take 1 tablet (40 mg total) by mouth daily. 30 tablet 3   potassium chloride (KLOR-CON) 10 MEQ tablet 2 po qd 180 tablet 1   potassium chloride (KLOR-CON) 10 MEQ tablet Take 1 tablet (10 mEq total) by mouth 3 (three) times daily. 270 tablet 2   rosuvastatin (CRESTOR) 20 MG tablet Take 1 tablet (20 mg total) by mouth daily. 90 tablet 3   traZODone (DESYREL) 50 MG tablet Take 0.5-1 tablets (25-50 mg total) by mouth at bedtime as needed for sleep. 30 tablet 0  No current facility-administered medications on file prior to visit.    BP 126/70    Pulse 78    Resp 18    Ht 5\' 5"  (1.651 m)    Wt 212 lb 6.4 oz (96.3 kg)    SpO2 100%    BMI 35.35 kg/m       Objective:   Physical Exam   General Mental Status- Alert. General Appearance- Not in acute distress.   Skin General: Color- Normal Color. Moisture- Normal Moisture.  Neck Carotid Arteries- Normal color. Moisture- Normal Moisture. No carotid bruits. No JVD.  Chest and Lung Exam Auscultation: Breath Sounds:-Normal.  Cardiovascular Auscultation:Rythm- Regular. Murmurs & Other Heart  Sounds:Auscultation of the heart reveals- No Murmurs.  Abdomen Inspection:-Inspeection Normal. Palpation/Percussion:Note:No mass. Palpation and Percussion of the abdomen reveal- Non Tender, Non Distended + BS, no rebound or guarding.   Neurologic Cranial Nerve exam:- CN III-XII intact(No nystagmus), symmetric smile. Strength:- 5/5 equal and symmetric strength both upper and lower extremities.     Assessment & Plan:  With your left side abdomen pain and history of diverticulitis will give you cipro and flagyl. With your upcoming trip I do think putting you on antibiotics is a good idea.   Will watch you closely. After discussion on potential timing of ct abd/pelvis decided to go ahead and place order based on history and upcoming trip and request.  Recommend eat healthy and stay hydrated  Follow up date to be determined.  , PA-C

## 2020-02-11 NOTE — Patient Instructions (Addendum)
With your left side abdomen pain and history of diverticulitis will give you cipro and flagyl. With your upcoming trip I do think putting you on antibiotics is a good idea.   Will watch you closely. After discussion on potential timing of ct abd/pelvis decided to go ahead and place order based on history and upcoming trip and request.  Recommend eat healthy and stay hydrated  Follow up date to be determined.

## 2020-02-11 NOTE — Telephone Encounter (Signed)
Abd pain. I put in ct abd/pelvis order. Will you do prior authorization. Pt can get tomorrow or Wednesday. Try for fomorrow.   Thursday she is busy.  Will you update me tomorrow.

## 2020-02-11 NOTE — Telephone Encounter (Signed)
Authorization obtained. I sent it to [facility] imaging. They are waiting on labs and will contact pt to schedule.

## 2020-02-12 ENCOUNTER — Encounter (HOSPITAL_BASED_OUTPATIENT_CLINIC_OR_DEPARTMENT_OTHER): Payer: Self-pay

## 2020-02-12 ENCOUNTER — Ambulatory Visit (HOSPITAL_BASED_OUTPATIENT_CLINIC_OR_DEPARTMENT_OTHER)
Admission: RE | Admit: 2020-02-12 | Discharge: 2020-02-12 | Disposition: A | Discharge status: Home / Self Care | Payer: BC Managed Care – PPO | Source: Ambulatory Visit | Attending: Medical | Admitting: Medical

## 2020-02-12 ENCOUNTER — Encounter: Payer: Self-pay | Admitting: Medical

## 2020-02-12 DIAGNOSIS — K5730 Diverticulosis of large intestine without perforation or abscess without bleeding: Secondary | ICD-10-CM | POA: Diagnosis not present

## 2020-02-12 DIAGNOSIS — R109 Unspecified abdominal pain: Secondary | ICD-10-CM | POA: Diagnosis not present

## 2020-02-12 DIAGNOSIS — I700 Atherosclerosis of aorta: Secondary | ICD-10-CM | POA: Diagnosis not present

## 2020-02-12 DIAGNOSIS — K5792 Diverticulitis of intestine, part unspecified, without perforation or abscess without bleeding: Secondary | ICD-10-CM | POA: Diagnosis not present

## 2020-02-12 LAB — CBC WITH DIFFERENTIAL/PLATELET
Basophils Absolute: 0.1 10*3/uL (ref 0.0–0.1)
Basophils Relative: 1.4 % (ref 0.0–3.0)
Eosinophils Absolute: 0.1 10*3/uL (ref 0.0–0.7)
Eosinophils Relative: 1.9 % (ref 0.0–5.0)
HCT: 39.3 % (ref 36.0–46.0)
Hemoglobin: 13.4 g/dL (ref 12.0–15.0)
Lymphocytes Relative: 39.1 % (ref 12.0–46.0)
Lymphs Abs: 2.6 10*3/uL (ref 0.7–4.0)
MCHC: 34.2 g/dL (ref 30.0–36.0)
MCV: 86.9 fl (ref 78.0–100.0)
Monocytes Absolute: 0.3 10*3/uL (ref 0.1–1.0)
Monocytes Relative: 4.6 % (ref 3.0–12.0)
Neutro Abs: 3.6 10*3/uL (ref 1.4–7.7)
Neutrophils Relative %: 53 % (ref 43.0–77.0)
Platelets: 321 10*3/uL (ref 150.0–400.0)
RBC: 4.52 Mil/uL (ref 3.87–5.11)
RDW: 13 % (ref 11.5–15.5)
WBC: 6.7 10*3/uL (ref 4.0–10.5)

## 2020-02-12 LAB — COMPREHENSIVE METABOLIC PANEL
ALT: 15 U/L (ref 0–35)
AST: 17 U/L (ref 0–37)
Albumin: 4 g/dL (ref 3.5–5.2)
Alkaline Phosphatase: 79 U/L (ref 39–117)
BUN: 15 mg/dL (ref 6–23)
CO2: 33 mEq/L — ABNORMAL HIGH (ref 19–32)
Calcium: 10 mg/dL (ref 8.4–10.5)
Chloride: 100 mEq/L (ref 96–112)
Creatinine, Ser: 0.92 mg/dL (ref 0.40–1.20)
GFR: 67.93 mL/min (ref >60.00)
Glucose, Bld: 87 mg/dL (ref 70–99)
Potassium: 3.6 mEq/L (ref 3.5–5.1)
Sodium: 138 mEq/L (ref 135–145)
Total Bilirubin: 0.6 mg/dL (ref 0.2–1.2)
Total Protein: 6.8 g/dL (ref 6.0–8.3)

## 2020-02-12 MED ORDER — IOHEXOL 300 MG/ML  SOLN
100.0000 mL | Freq: Once | INTRAMUSCULAR | Status: AC | PRN
  Administered 2020-02-12: 18:00:00 100 mL via INTRAVENOUS

## 2020-02-13 ENCOUNTER — Encounter: Payer: Self-pay | Admitting: Medical

## 2020-02-24 DIAGNOSIS — Z1152 Encounter for screening for COVID-19: Secondary | ICD-10-CM | POA: Diagnosis not present

## 2020-06-30 ENCOUNTER — Ambulatory Visit: Payer: BC Managed Care – PPO | Admitting: Medical

## 2020-06-30 ENCOUNTER — Other Ambulatory Visit: Payer: Self-pay

## 2020-06-30 VITALS — BP 142/70 | HR 86 | Temp 98.3°F | Resp 16 | Ht 65.0 in | Wt 208.0 lb

## 2020-06-30 DIAGNOSIS — J3489 Other specified disorders of nose and nasal sinuses: Secondary | ICD-10-CM

## 2020-06-30 DIAGNOSIS — R6883 Chills (without fever): Secondary | ICD-10-CM

## 2020-06-30 DIAGNOSIS — H9203 Otalgia, bilateral: Secondary | ICD-10-CM | POA: Diagnosis not present

## 2020-06-30 DIAGNOSIS — J029 Acute pharyngitis, unspecified: Secondary | ICD-10-CM | POA: Diagnosis not present

## 2020-06-30 MED ORDER — AZITHROMYCIN 250 MG PO TABS: ORAL_TABLET | ORAL | 0 refills | Status: AC

## 2020-06-30 MED ORDER — FLUTICASONE PROPIONATE 50 MCG/ACT NA SUSP: 2.0000 | Freq: Every day | NASAL | 1 refills | Status: DC

## 2020-06-30 MED ORDER — BENZONATATE 100 MG PO CAPS: 100.0000 mg | ORAL_CAPSULE | Freq: Three times a day (TID) | ORAL | 0 refills | Status: DC | PRN

## 2020-06-30 NOTE — Progress Notes (Signed)
Subjective:    Patient ID: Diana Randall, female    DOB: [birth year], 60 y.o.   MRN: [id number]  HPI  Pt in with symptoms of chills, clammy sensation, sinus pressure, ear ache and sore throat for 5 days. Pt did covid test on Saturday and was negative.   Pt symptoms started Wednesday of last week.   Pt states temp was in 90's and restaurants were very cold while she was traveling in [place].  Today 5 days post symptom onset. Tested negative COVID at [place] 2 days ago.   Did do yard work before she left for [place]  Review of Systems  Constitutional: Negative for chills, fatigue and fever.  HENT: Positive for congestion, ear pain and sore throat. Negative for nosebleeds.   Respiratory: Negative for cough, chest tightness, shortness of breath and wheezing.   Cardiovascular: Negative for chest pain and palpitations.  Gastrointestinal: Negative for abdominal pain, constipation and diarrhea.  Genitourinary: Negative for dysuria, flank pain and frequency.  Musculoskeletal: Negative for back pain, gait problem and neck pain.  Skin: Negative for rash.  Neurological: Negative for dizziness, weakness, numbness and headaches.  Hematological: Negative for adenopathy. Does not bruise/bleed easily.  Psychiatric/Behavioral: Negative for behavioral problems, confusion and hallucinations. The patient is not nervous/anxious.     Past Medical History:  Diagnosis Date  . GERD (gastroesophageal reflux disease)   . Hyperlipidemia   . Hypertension      Social History   Socioeconomic History  . Marital status: Married    Spouse name: Not on file  . Number of children: Not on file  . Years of education: Not on file  . Highest education level: Not on file  Occupational History  . Not on file  Tobacco Use  . Smoking status: Never Smoker  . Smokeless tobacco: Never Used  Substance and Sexual Activity  . Alcohol use: Yes  . Drug use: No  . Sexual activity: Not Currently     Partners: Male  Other Topics Concern  . Not on file  Social History Narrative  . Not on file   Social Determinants of Health   Financial Resource Strain: Not on file  Food Insecurity: Not on file  Transportation Needs: Not on file  Physical Activity: Not on file  Stress: Not on file  Social Connections: Not on file  Intimate Partner Violence: Not on file    No past surgical history on file.  Family History  Problem Relation Age of Onset  . Stomach cancer Brother     Allergies  Allergen Reactions  . Latex Rash    Current Outpatient Medications on File Prior to Visit  Medication Sig Dispense Refill  . amLODipine (NORVASC) 10 MG tablet Take 1 tablet (10 mg total) by mouth daily. 90 tablet 1  . olmesartan-hydrochlorothiazide (BENICAR HCT) 40-25 MG tablet Take 1 tablet by mouth daily. 90 tablet 1  . pantoprazole (PROTONIX) 40 MG tablet Take 1 tablet (40 mg total) by mouth daily. 30 tablet 3  . potassium chloride (KLOR-CON) 10 MEQ tablet 2 po qd 180 tablet 1  . rosuvastatin (CRESTOR) 20 MG tablet Take 1 tablet (20 mg total) by mouth daily. 90 tablet 3   No current facility-administered medications on file prior to visit.    BP (!) 142/70   Pulse 86   Temp 98.3 F (36.8 C)   Resp 16   Ht 5\' 5"  (1.651 m)   Wt 208 lb (94.3 kg)   SpO2 100%  BMI 34.61 kg/m       Objective:   Physical Exam  General Mental Status- Alert. General Appearance- Not in acute distress.   Skin General: Color- Normal Color. Moisture- Normal Moisture.  Neck Carotid Arteries- Normal color. Moisture- Normal Moisture. No carotid bruits. No JVD.  Chest and Lung Exam Auscultation: Breath Sounds:-Normal.  Cardiovascular Auscultation:Rythm- Regular. Murmurs & Other Heart Sounds:Auscultation of the heart reveals- No Murmurs.  Abdomen Inspection:-Inspeection Normal. Palpation/Percussion:Note:No mass. Palpation and Percussion of the abdomen reveal- Non Tender, Non Distended + BS, no  rebound or guarding.    Neurologic Cranial Nerve exam:- CN III-XII intact(No nystagmus), symmetric smile. Strength:- 5/5 equal and symmetric strength both upper and lower extremities.  heent- frontal and maxillary sinus pressure. Both tm- mild pink color to center of tm bilaterally. Throat- mild red pharynx. No hypertrophy      Assessment & Plan:  You have sinus pressure, ear pain and sore throat for past 5 days.  Some yard work done prior to onset of symptoms and some exposure to drastic temperature changes as well.  Will prescribe azithromycin antibiotic to cover for sinus infection, pharyngitis and ear infection.  Flonase nasal spray for nasal congestion.  Benzonatate cough tablets prescribed as well.  1 COVID test already -2 days ago.  History of COVID 1 time in the past as well as 3 vaccines.  Doubt COVID but for safety sake of fellow employees recommend getting repeat COVID test tomorrow.  If negative and feeling significantly better then returned to work on Wednesday.

## 2020-06-30 NOTE — Patient Instructions (Addendum)
You have sinus pressure, ear pain and sore throat for past 5 days.  Some yard work done prior to onset of symptoms and some exposure to drastic temperature changes as well.  Will prescribe azithromycin antibiotic to cover for sinus infection, pharyngitis and ear infection.  Flonase nasal spray for nasal congestion.  Benzonatate cough tablets prescribed as well.  1 COVID test already -2 days ago.  History of COVID 1 time in the past as well as 3 vaccines.  Doubt COVID but for safety sake of fellow employees recommend getting repeat COVID test tomorrow.  If negative and feeling significantly better then returned to work on Wednesday.

## 2020-06-30 NOTE — Addendum Note (Signed)
Addended by: Gwenevere Abbot on: 06/30/2020 02:10 PM   Modules accepted: Orders

## 2020-07-07 ENCOUNTER — Ambulatory Visit: Payer: BC Managed Care – PPO | Admitting: Family Medicine

## 2020-08-14 DIAGNOSIS — Z1231 Encounter for screening mammogram for malignant neoplasm of breast: Secondary | ICD-10-CM | POA: Diagnosis not present

## 2020-08-14 LAB — HM MAMMOGRAPHY

## 2020-09-19 ENCOUNTER — Other Ambulatory Visit: Payer: Self-pay

## 2020-09-19 ENCOUNTER — Ambulatory Visit: Payer: BC Managed Care – PPO | Admitting: Family Medicine

## 2020-09-19 ENCOUNTER — Encounter: Payer: Self-pay | Admitting: Family Medicine

## 2020-09-19 DIAGNOSIS — E785 Hyperlipidemia, unspecified: Secondary | ICD-10-CM | POA: Diagnosis not present

## 2020-09-19 DIAGNOSIS — K219 Gastro-esophageal reflux disease without esophagitis: Secondary | ICD-10-CM

## 2020-09-19 DIAGNOSIS — I10 Essential (primary) hypertension: Secondary | ICD-10-CM

## 2020-09-19 DIAGNOSIS — E876 Hypokalemia: Secondary | ICD-10-CM

## 2020-09-19 LAB — COMPREHENSIVE METABOLIC PANEL
ALT: 15 U/L (ref 0–35)
AST: 16 U/L (ref 0–37)
Albumin: 4 g/dL (ref 3.5–5.2)
Alkaline Phosphatase: 75 U/L (ref 39–117)
BUN: 16 mg/dL (ref 6–23)
CO2: 33 mEq/L — ABNORMAL HIGH (ref 19–32)
Calcium: 9.7 mg/dL (ref 8.4–10.5)
Chloride: 101 mEq/L (ref 96–112)
Creatinine, Ser: 0.89 mg/dL (ref 0.40–1.20)
GFR: 70.39 mL/min (ref >60.00)
Glucose, Bld: 99 mg/dL (ref 70–99)
Potassium: 3.5 mEq/L (ref 3.5–5.1)
Sodium: 140 mEq/L (ref 135–145)
Total Bilirubin: 0.7 mg/dL (ref 0.2–1.2)
Total Protein: 6.8 g/dL (ref 6.0–8.3)

## 2020-09-19 LAB — LIPID PANEL
Cholesterol: 267 mg/dL — ABNORMAL HIGH (ref 0–200)
HDL: 57.4 mg/dL (ref >39.00)
LDL Cholesterol: 188 mg/dL — ABNORMAL HIGH (ref 0–99)
NonHDL: 209.91
Total CHOL/HDL Ratio: 5
Triglycerides: 109 mg/dL (ref 0.0–149.0)
VLDL: 21.8 mg/dL (ref 0.0–40.0)

## 2020-09-19 MED ORDER — OLMESARTAN MEDOXOMIL-HCTZ 40-25 MG PO TABS: 1.0000 | ORAL_TABLET | Freq: Every day | ORAL | 1 refills | Status: DC

## 2020-09-19 MED ORDER — ROSUVASTATIN CALCIUM 20 MG PO TABS: 20.0000 mg | ORAL_TABLET | Freq: Every day | ORAL | 1 refills | Status: DC

## 2020-09-19 MED ORDER — AMLODIPINE BESYLATE 10 MG PO TABS: 10.0000 mg | ORAL_TABLET | Freq: Every day | ORAL | 1 refills | Status: DC

## 2020-09-19 MED ORDER — POTASSIUM CHLORIDE ER 10 MEQ PO TBCR: EXTENDED_RELEASE_TABLET | ORAL | 1 refills | Status: DC

## 2020-09-19 MED ORDER — PANTOPRAZOLE SODIUM 40 MG PO TBEC: 40.0000 mg | DELAYED_RELEASE_TABLET | Freq: Two times a day (BID) | ORAL | 3 refills | Status: DC

## 2020-09-19 NOTE — Patient Instructions (Signed)

## 2020-09-19 NOTE — Progress Notes (Addendum)
Subjective:   By signing my name below, I, Diana Randall, attest that this documentation has been prepared under the direction and in the presence of Diana Schultz, DO 09/19/2020    Patient ID: Diana Randall, female    DOB: July 27, 1960, 60 y.o.   MRN: [id number]  No chief complaint on file.   HPI Patient is in today for an office visit.  She mentions she is doing well. She is requesting a refill on all of her medications. She recently saw her GI and he recommended that she increase the dosage of 40 mg Protonix to 2x daily PO.  She recently had an endoscopy done in September and they found some ulcers on her oesophagus. She had to have another endoscopy done in December and results were normal. She has been advised to repeat it once a year since her brother died of stomach cancer. She works from home 2-3 times a week. She has 3 Pfizer Covid-19 vaccines at this time. She is not UTD on the shingles vaccine.  Past Medical History:  Diagnosis Date   GERD (gastroesophageal reflux disease)    Hyperlipidemia    Hypertension     History reviewed. No pertinent surgical history.  Family History  Problem Relation Age of Onset   Stomach cancer Brother     Social History   Socioeconomic History   Marital status: Married    Spouse name: Not on file   Number of children: Not on file   Years of education: Not on file   Highest education level: Not on file  Occupational History   Not on file  Tobacco Use   Smoking status: Never   Smokeless tobacco: Never  Substance and Sexual Activity   Alcohol use: Yes   Drug use: No   Sexual activity: Not Currently    Partners: Male  Other Topics Concern   Not on file  Social History Narrative   Not on file   Social Determinants of Health   Financial Resource Strain: Not on file  Food Insecurity: Not on file  Transportation Needs: Not on file  Physical Activity: Not on file  Stress: Not on file  Social Connections: Not on file   Intimate Partner Violence: Not on file    Outpatient Medications Prior to Visit  Medication Sig Dispense Refill   fluticasone (FLONASE) 50 MCG/ACT nasal spray Place 2 sprays into both nostrils daily. 16 g 1   amLODipine (NORVASC) 10 MG tablet Take 1 tablet (10 mg total) by mouth daily. 90 tablet 1   olmesartan-hydrochlorothiazide (BENICAR HCT) 40-25 MG tablet Take 1 tablet by mouth daily. 90 tablet 1   pantoprazole (PROTONIX) 40 MG tablet Take 1 tablet by mouth in the morning and at bedtime.     potassium chloride (KLOR-CON) 10 MEQ tablet 2 po qd 180 tablet 1   rosuvastatin (CRESTOR) 20 MG tablet Take 1 tablet (20 mg total) by mouth daily. 90 tablet 3   benzonatate (TESSALON) 100 MG capsule Take 1 capsule (100 mg total) by mouth 3 (three) times daily as needed for cough. 30 capsule 0   pantoprazole (PROTONIX) 40 MG tablet Take 1 tablet (40 mg total) by mouth daily. 30 tablet 3   No facility-administered medications prior to visit.    Allergies  Allergen Reactions   Latex Rash    Review of Systems  Constitutional:  Negative for fever and malaise/fatigue.  HENT:  Negative for congestion.   Eyes:  Negative for blurred vision.  Respiratory:  Negative for shortness of breath.   Cardiovascular:  Negative for chest pain, palpitations and leg swelling.  Gastrointestinal:  Negative for abdominal pain, blood in stool and nausea.  Genitourinary:  Negative for dysuria and frequency.  Musculoskeletal:  Negative for falls.  Skin:  Negative for rash.  Neurological:  Negative for dizziness, loss of consciousness and headaches.  Endo/Heme/Allergies:  Negative for environmental allergies.  Psychiatric/Behavioral:  Negative for depression. The patient is not nervous/anxious.       Objective:    Physical Exam Vitals and nursing note reviewed.  Constitutional:      General: She is not in acute distress.    Appearance: Normal appearance. She is not ill-appearing.  HENT:     Head:  Normocephalic and atraumatic.     Right Ear: External ear normal.     Left Ear: External ear normal.  Eyes:     Extraocular Movements: Extraocular movements intact.     Pupils: Pupils are equal, round, and reactive to light.  Cardiovascular:     Rate and Rhythm: Normal rate and regular rhythm.     Pulses: Normal pulses.     Heart sounds: Normal heart sounds. No murmur heard.   No gallop.  Pulmonary:     Effort: Pulmonary effort is normal. No respiratory distress.     Breath sounds: Normal breath sounds. No wheezing, rhonchi or rales.  Abdominal:     General: Bowel sounds are normal. There is no distension.     Palpations: Abdomen is soft. There is no mass.     Tenderness: There is no abdominal tenderness. There is no guarding or rebound.     Hernia: No hernia is present.  Musculoskeletal:     Cervical back: Normal range of motion and neck supple.  Lymphadenopathy:     Cervical: No cervical adenopathy.  Skin:    General: Skin is warm and dry.  Neurological:     Mental Status: She is alert and oriented to person, place, and time.  Psychiatric:        Behavior: Behavior normal.    BP 128/78 (BP Location: Left Arm, Patient Position: Sitting, Cuff Size: Large)   Pulse 82   Ht 5\' 5"  (1.651 m)   Wt 207 lb (93.9 kg)   SpO2 98%   BMI 34.45 kg/m  Wt Readings from Last 3 Encounters:  09/19/20 207 lb (93.9 kg)  06/30/20 208 lb (94.3 kg)  02/11/20 212 lb 6.4 oz (96.3 kg)    Diabetic Foot Exam - Simple   No data filed    Lab Results  Component Value Date   WBC 6.7 02/11/2020   HGB 13.4 02/11/2020   HCT 39.3 02/11/2020   PLT 321.0 02/11/2020   GLUCOSE 87 02/11/2020   CHOL 269 (H) 04/26/2019   TRIG 101.0 04/26/2019   HDL 59.40 04/26/2019   LDLDIRECT 151.9 11/06/2012   LDLCALC 189 (H) 04/26/2019   ALT 15 02/11/2020   AST 17 02/11/2020   NA 138 02/11/2020   K 3.6 02/11/2020   CL 100 02/11/2020   CREATININE 0.92 02/11/2020   BUN 15 02/11/2020   CO2 33 (H) 02/11/2020    TSH 1.61 04/26/2019   MICROALBUR 1.2 11/06/2012    Lab Results  Component Value Date   TSH 1.61 04/26/2019   Lab Results  Component Value Date   WBC 6.7 02/11/2020   HGB 13.4 02/11/2020   HCT 39.3 02/11/2020   MCV 86.9 02/11/2020   PLT 321.0 02/11/2020  Lab Results  Component Value Date   NA 138 02/11/2020   K 3.6 02/11/2020   CO2 33 (H) 02/11/2020   GLUCOSE 87 02/11/2020   BUN 15 02/11/2020   CREATININE 0.92 02/11/2020   BILITOT 0.6 02/11/2020   ALKPHOS 79 02/11/2020   AST 17 02/11/2020   ALT 15 02/11/2020   PROT 6.8 02/11/2020   ALBUMIN 4.0 02/11/2020   CALCIUM 10.0 02/11/2020   GFR 67.93 02/11/2020   Lab Results  Component Value Date   CHOL 269 (H) 04/26/2019   Lab Results  Component Value Date   HDL 59.40 04/26/2019   Lab Results  Component Value Date   LDLCALC 189 (H) 04/26/2019   Lab Results  Component Value Date   TRIG 101.0 04/26/2019   Lab Results  Component Value Date   CHOLHDL 5 04/26/2019   No results found for: HGBA1C     Assessment & Plan:   Problem List Items Addressed This Visit       Unprioritized   HTN (hypertension) - Primary   Relevant Medications   amLODipine (NORVASC) 10 MG tablet   olmesartan-hydrochlorothiazide (BENICAR HCT) 40-25 MG tablet   rosuvastatin (CRESTOR) 20 MG tablet   Other Relevant Orders   Lipid panel   Comprehensive metabolic panel   Gastroesophageal reflux disease   Relevant Medications   pantoprazole (PROTONIX) 40 MG tablet   Hypokalemia   Relevant Medications   potassium chloride (KLOR-CON) 10 MEQ tablet   Essential hypertension    Well controlled, no changes to meds. Encouraged heart healthy diet such as the DASH diet and exercise as tolerated.        Relevant Medications   amLODipine (NORVASC) 10 MG tablet   olmesartan-hydrochlorothiazide (BENICAR HCT) 40-25 MG tablet   rosuvastatin (CRESTOR) 20 MG tablet   Hyperlipidemia    Encourage heart healthy diet such as MIND or DASH diet,  increase exercise, avoid trans fats, simple carbohydrates and processed foods, consider a krill or fish or flaxseed oil cap daily.        Relevant Medications   amLODipine (NORVASC) 10 MG tablet   olmesartan-hydrochlorothiazide (BENICAR HCT) 40-25 MG tablet   rosuvastatin (CRESTOR) 20 MG tablet   Other Relevant Orders   Lipid panel   Comprehensive metabolic panel   Morbid obesity (HCC)    Meds ordered this encounter  Medications   amLODipine (NORVASC) 10 MG tablet    Sig: Take 1 tablet (10 mg total) by mouth daily.    Dispense:  90 tablet    Refill:  1   olmesartan-hydrochlorothiazide (BENICAR HCT) 40-25 MG tablet    Sig: Take 1 tablet by mouth daily.    Dispense:  90 tablet    Refill:  1   rosuvastatin (CRESTOR) 20 MG tablet    Sig: Take 1 tablet (20 mg total) by mouth daily.    Dispense:  90 tablet    Refill:  1   potassium chloride (KLOR-CON) 10 MEQ tablet    Sig: 2 po qd    Dispense:  180 tablet    Refill:  1   pantoprazole (PROTONIX) 40 MG tablet    Sig: Take 1 tablet (40 mg total) by mouth 2 (two) times daily before a meal.    Dispense:  180 tablet    Refill:  3     I,Diana Randall,acting as a scribe for [employer], DO.,have documented all relevant documentation on the behalf of Diana Schultz, DO,as directed by  Grayling Congress  Zola Button, DO while in the presence of Diana Schultz, DO.   I, Diana Schultz, DO , personally preformed the services described in this documentation.  All medical record entries made by the scribe were at my direction and in my presence.  I have reviewed the chart and discharge instructions (if applicable) and agree that the record reflects my personal performance and is accurate and complete. 09/19/2020

## 2020-09-19 NOTE — Assessment & Plan Note (Signed)
Encourage heart healthy diet such as MIND or DASH diet, increase exercise, avoid trans fats, simple carbohydrates and processed foods, consider a krill or fish or flaxseed oil cap daily.  °

## 2020-09-19 NOTE — Assessment & Plan Note (Signed)
Well controlled, no changes to meds. Encouraged heart healthy diet such as the DASH diet and exercise as tolerated.  °

## 2020-09-24 ENCOUNTER — Other Ambulatory Visit: Payer: Self-pay | Admitting: Family Medicine

## 2020-09-24 DIAGNOSIS — E785 Hyperlipidemia, unspecified: Secondary | ICD-10-CM

## 2020-11-01 DIAGNOSIS — Z20822 Contact with and (suspected) exposure to covid-19: Secondary | ICD-10-CM | POA: Diagnosis not present

## 2020-11-01 DIAGNOSIS — R051 Acute cough: Secondary | ICD-10-CM | POA: Diagnosis not present

## 2020-11-01 DIAGNOSIS — R519 Headache, unspecified: Secondary | ICD-10-CM | POA: Diagnosis not present

## 2020-11-01 DIAGNOSIS — R062 Wheezing: Secondary | ICD-10-CM | POA: Diagnosis not present

## 2020-11-04 ENCOUNTER — Encounter: Payer: Self-pay | Admitting: Family Medicine

## 2020-11-04 ENCOUNTER — Ambulatory Visit: Payer: BC Managed Care – PPO | Admitting: Family Medicine

## 2020-11-04 ENCOUNTER — Other Ambulatory Visit: Payer: Self-pay

## 2020-11-04 ENCOUNTER — Ambulatory Visit (HOSPITAL_BASED_OUTPATIENT_CLINIC_OR_DEPARTMENT_OTHER)
Admission: RE | Admit: 2020-11-04 | Discharge: 2020-11-04 | Disposition: A | Discharge status: Home / Self Care | Payer: BC Managed Care – PPO | Source: Ambulatory Visit | Attending: Family Medicine | Admitting: Family Medicine

## 2020-11-04 VITALS — BP 140/100 | HR 77 | Temp 99.4°F | Resp 18 | Ht 65.0 in | Wt 208.2 lb

## 2020-11-04 DIAGNOSIS — J9801 Acute bronchospasm: Secondary | ICD-10-CM | POA: Insufficient documentation

## 2020-11-04 DIAGNOSIS — R059 Cough, unspecified: Secondary | ICD-10-CM | POA: Diagnosis not present

## 2020-11-04 DIAGNOSIS — R062 Wheezing: Secondary | ICD-10-CM | POA: Diagnosis not present

## 2020-11-04 DIAGNOSIS — J111 Influenza due to unidentified influenza virus with other respiratory manifestations: Secondary | ICD-10-CM | POA: Diagnosis not present

## 2020-11-04 DIAGNOSIS — J189 Pneumonia, unspecified organism: Secondary | ICD-10-CM

## 2020-11-04 LAB — COMPREHENSIVE METABOLIC PANEL
ALT: 15 U/L (ref 0–35)
AST: 14 U/L (ref 0–37)
Albumin: 4.1 g/dL (ref 3.5–5.2)
Alkaline Phosphatase: 68 U/L (ref 39–117)
BUN: 26 mg/dL — ABNORMAL HIGH (ref 6–23)
CO2: 27 mEq/L (ref 19–32)
Calcium: 9.9 mg/dL (ref 8.4–10.5)
Chloride: 106 mEq/L (ref 96–112)
Creatinine, Ser: 0.9 mg/dL (ref 0.40–1.20)
GFR: 69.39 mL/min (ref >60.00)
Glucose, Bld: 153 mg/dL — ABNORMAL HIGH (ref 70–99)
Potassium: 3.9 mEq/L (ref 3.5–5.1)
Sodium: 142 mEq/L (ref 135–145)
Total Bilirubin: 0.5 mg/dL (ref 0.2–1.2)
Total Protein: 7.1 g/dL (ref 6.0–8.3)

## 2020-11-04 LAB — CBC WITH DIFFERENTIAL/PLATELET
Basophils Absolute: 0 10*3/uL (ref 0.0–0.1)
Basophils Relative: 0.1 % (ref 0.0–3.0)
Eosinophils Absolute: 0 10*3/uL (ref 0.0–0.7)
Eosinophils Relative: 0 % (ref 0.0–5.0)
HCT: 41.2 % (ref 36.0–46.0)
Hemoglobin: 13.5 g/dL (ref 12.0–15.0)
Lymphocytes Relative: 11.6 % — ABNORMAL LOW (ref 12.0–46.0)
Lymphs Abs: 1.9 10*3/uL (ref 0.7–4.0)
MCHC: 32.9 g/dL (ref 30.0–36.0)
MCV: 88.6 fl (ref 78.0–100.0)
Monocytes Absolute: 0.5 10*3/uL (ref 0.1–1.0)
Monocytes Relative: 3.3 % (ref 3.0–12.0)
Neutro Abs: 13.6 10*3/uL — ABNORMAL HIGH (ref 1.4–7.7)
Neutrophils Relative %: 85 % — ABNORMAL HIGH (ref 43.0–77.0)
Platelets: 316 10*3/uL (ref 150.0–400.0)
RBC: 4.65 Mil/uL (ref 3.87–5.11)
RDW: 13.4 % (ref 11.5–15.5)
WBC: 16 10*3/uL — ABNORMAL HIGH (ref 4.0–10.5)

## 2020-11-04 MED ORDER — PROMETHAZINE-DM 6.25-15 MG/5ML PO SYRP: 5.0000 mL | ORAL_SOLUTION | Freq: Four times a day (QID) | ORAL | 0 refills | Status: DC | PRN

## 2020-11-04 MED ORDER — METHYLPREDNISOLONE ACETATE 80 MG/ML IJ SUSP
80.0000 mg | Freq: Once | INTRAMUSCULAR | Status: AC
  Administered 2020-11-04: 80 mg via INTRAMUSCULAR

## 2020-11-04 MED ORDER — PREDNISONE 10 MG PO TABS: ORAL_TABLET | ORAL | 0 refills | Status: DC

## 2020-11-04 NOTE — Assessment & Plan Note (Signed)
Finish tamiflu Fluids and rest rto prn

## 2020-11-04 NOTE — Patient Instructions (Signed)
Community-Acquired Pneumonia, Adult ?Pneumonia is a lung infection that causes inflammation and the buildup of mucus and fluids in the lungs. This may cause coughing and difficulty breathing. Community-acquired pneumonia is pneumonia that develops in people who are not, and have not recently been, in a hospital or other health care facility. ?Usually, pneumonia develops as a result of an illness that is caused by a virus, such as the common cold and the flu (influenza). It can also be caused by bacteria or fungi. While the common cold and influenza can pass from person to person (are contagious), pneumonia itself is not considered contagious. ?What are the causes? ?This condition may be caused by: ?Viruses. ?Bacteria. ?Fungi, such as molds or mushrooms. ?What increases the risk? ?The following factors may make you more likely to develop this condition: ?Having certain medical conditions, such as: ?A long-term (chronic) disease, which may include chronic obstructive pulmonary disease (COPD), asthma, heart failure, cystic fibrosis, diabetes, kidney disease, sickle cell disease, and human immunodeficiency virus (HIV). ?A condition that increases the risk of breathing in (aspirating) mucus and other fluids from your mouth and nose. ?A weakened body defense system (immune system). ?Having had your spleen removed (splenectomy). The spleen is the organ that helps fight germs and infections. ?Not cleaning your teeth and gums well (poor dental hygiene). ?Using tobacco products. ?Traveling to places where germs that cause pneumonia are present. ?Being near certain animals, or animal habitats, that have germs that cause pneumonia. ?Being older than 60 years of age. ?What are the signs or symptoms? ?Symptoms of this condition include: ?A dry cough or a wet (productive) cough. ?A fever. ?Sweating or chills. ?Chest pain, especially when breathing deeply or coughing. ?Fast breathing, difficulty breathing, or shortness of  breath. ?Tiredness (fatigue). ?Muscle aches. ?How is this diagnosed? ?This condition may be diagnosed based on your medical history or a physical exam. You may also have tests, including: ?Chest X-rays. ?Tests of the level of oxygen and other gases in your blood. ?Tests of: ?Your blood. ?Mucus from your lungs (sputum). ?Fluid around your lungs (pleural fluid). ?Your urine. ?If your pneumonia is severe, other tests may be done to learn more about the cause. ?How is this treated? ?Treatment for this condition depends on many factors, such as the cause of your pneumonia, your medicines, and other medical conditions that you have. ?For most adults, pneumonia may be treated at home. In some cases, treatment must happen in a hospital and may include: ?Medicines that are given by mouth (orally) or through an IV, including: ?Antibiotic medicines, if bacteria caused the pneumonia. ?Medicines that kill viruses (antiviral medicines), if a virus caused the pneumonia. ?Oxygen therapy. ?Severe pneumonia, although rare, may require the following treatments: ?Mechanical ventilation.This procedure uses a machine to help you breathe if you cannot breathe well on your own or maintain a safe level of blood oxygen. ?Thoracentesis. This procedure removes any buildup of pleural fluid to help with breathing. ?Follow these instructions at home: ?Medicines ?Take over-the-counter and prescription medicines only as told by your health care provider. ?Take cough medicine only if you have trouble sleeping. Cough medicine can prevent your body from removing mucus from your lungs. ?If you were prescribed an antibiotic medicine, take it as told by your health care provider. Do not stop taking the antibiotic even if you start to feel better. ?Lifestyle ?  ?Do not drink alcohol. ?Do not use any products that contain nicotine or tobacco, such as cigarettes, e-cigarettes, and chewing tobacco. If you   need help quitting, ask your health care  provider. ?Eat a healthy diet. This includes plenty of vegetables, fruits, whole grains, low-fat dairy products, and lean protein. ?General instructions ?Rest a lot and get at least 8 hours of sleep each night. ?Sleep in a partly upright position at night. Place a few pillows under your head or sleep in a reclining chair. ?Return to your normal activities as told by your health care provider. Ask your health care provider what activities are safe for you. ?Drink enough fluid to keep your urine pale yellow. This helps to thin the mucus in your lungs. ?If your throat is sore, gargle with a salt-water mixture 3-4 times a day or as needed. To make a salt-water mixture, completely dissolve ?-1 tsp (3-6 g) of salt in 1 cup (237 mL) of warm water. ?Keep all follow-up visits as told by your health care provider. This is important. ?How is this prevented? ?You can lower your risk of developing community-acquired pneumonia by: ?Getting the pneumonia vaccine. There are different types and schedules of pneumonia vaccines. Ask your health care provider which option is best for you. Consider getting the pneumonia vaccine if: ?You are older than 60 years of age. ?You are 19-65 years of age and are receiving cancer treatment, have chronic lung disease, or have other medical conditions that affect your immune system. Ask your health care provider if this applies to you. ?Getting your influenza vaccine every year. Ask your health care provider which type of vaccine is best for you. ?Getting regular dental checkups. ?Washing your hands often with soap and water for at least 20 seconds. If soap and water are not available, use hand sanitizer. ?Contact a health care provider if you have: ?A fever. ?Trouble sleeping because you cannot control your cough with cough medicine. ?Get help right away if: ?Your shortness of breath becomes worse. ?Your chest pain increases. ?Your sickness becomes worse, especially if you are an older adult or  have a weak immune system. ?You cough up blood. ?These symptoms may represent a serious problem that is an emergency. Do not wait to see if the symptoms will go away. Get medical help right away. Call your local emergency services (911 in the [place] Do not drive yourself to the hospital. ?Summary ?Pneumonia is an infection of the lungs. ?Community-acquired pneumonia develops in people who have not been in the hospital. It can be caused by bacteria, viruses, or fungi. ?This condition may be treated with antibiotics or antiviral medicines. ?Severe pneumonia may require a hospital stay and treatment to help with breathing. ?This information is not intended to replace advice given to you by your health care provider. Make sure you discuss any questions you have with your health care provider. ?Document Revised: 11/14/2018 Document Reviewed: 11/14/2018 ?Elsevier Patient Education ? 2022 [employer] ? ?

## 2020-11-04 NOTE — Assessment & Plan Note (Signed)
Finish abx  Changed the steroid to pred taper --- pt had headache with dexamethasone  Repeat cxr

## 2020-11-04 NOTE — Progress Notes (Addendum)
Subjective:   By signing my name below, I, Diana Randall, attest that this documentation has been prepared under the direction and in the presence of Diana Schultz, DO  11/04/2020    Patient ID: Diana Randall, female    DOB: 07-03-1960, 60 y.o.   MRN: [id number]  Chief Complaint  Patient presents with   Pneumonia   Influenza   Follow-up    HPI Patient is in today for a office visit.  She complains of frequent coughing and wheezing while laying down at this time. She denies having any shortness of breath at this time. She reports having an episode cough, wheeze, headache last Wednesday, 10/27/2020 while working and returned home feeling fatigued. She was doing well the next two days but reports on Saturday having frequent coughing, body aches, headaches, and fatigue and was taken to urgent care. She tested negative for Covid-19 but positive for the Flu. She then had an X-ray and found pneumonia in her left lung. She was prescribed tamiflu, albuterol, dexamethasone, levofloxacin to manage her infection on 11/01/2020. She reports that there is a vent above her desk at working and thinks mold at her workplace may have contributed to her symptoms and infection.  She continues taking tamiflu, albuterol, dexamethasone, levofloxacin to manage her symptoms at this time and finds relief. She is taking OTC delsym to manage her cough and only finds mild relief. She is interested in taking stronger cough medication to manage her cough.  Her dexamethasone prescription is causing her increased appetite and she is willing in switching to another steroid medication to resolve her symptoms.    Past Medical History:  Diagnosis Date   GERD (gastroesophageal reflux disease)    Hyperlipidemia    Hypertension     No past surgical history on file.  Family History  Problem Relation Age of Onset   Stomach cancer Brother     Social History   Socioeconomic History   Marital status: Married     Spouse name: Not on file   Number of children: Not on file   Years of education: Not on file   Highest education level: Not on file  Occupational History   Not on file  Tobacco Use   Smoking status: Never   Smokeless tobacco: Never  Substance and Sexual Activity   Alcohol use: Yes   Drug use: No   Sexual activity: Not Currently    Partners: Male  Other Topics Concern   Not on file  Social History Narrative   Not on file   Social Determinants of Health   Financial Resource Strain: Not on file  Food Insecurity: Not on file  Transportation Needs: Not on file  Physical Activity: Not on file  Stress: Not on file  Social Connections: Not on file  Intimate Partner Violence: Not on file    Outpatient Medications Prior to Visit  Medication Sig Dispense Refill   amLODipine (NORVASC) 10 MG tablet Take 1 tablet (10 mg total) by mouth daily. 90 tablet 1   fluticasone (FLONASE) 50 MCG/ACT nasal spray Place 2 sprays into both nostrils daily. 16 g 1   olmesartan-hydrochlorothiazide (BENICAR HCT) 40-25 MG tablet Take 1 tablet by mouth daily. 90 tablet 1   pantoprazole (PROTONIX) 40 MG tablet Take 1 tablet (40 mg total) by mouth 2 (two) times daily before a meal. 180 tablet 3   potassium chloride (KLOR-CON) 10 MEQ tablet 2 po qd 180 tablet 1   rosuvastatin (CRESTOR) 20 MG  tablet Take 1 tablet (20 mg total) by mouth daily. 90 tablet 1   No facility-administered medications prior to visit.    Allergies  Allergen Reactions   Latex Rash    Review of Systems  Constitutional:  Negative for chills, fever and malaise/fatigue.       (+)increased appetite  HENT:  Negative for congestion and hearing loss.   Eyes:  Negative for blurred vision and discharge.  Respiratory:  Positive for cough and wheezing (while laying down). Negative for sputum production and shortness of breath.   Cardiovascular:  Negative for chest pain, palpitations and leg swelling.  Gastrointestinal:  Negative for  abdominal pain, blood in stool, constipation, diarrhea, heartburn, nausea and vomiting.  Genitourinary:  Negative for dysuria, frequency, hematuria and urgency.  Musculoskeletal:  Negative for back pain, falls and myalgias.  Skin:  Negative for rash.  Neurological:  Negative for dizziness, sensory change, loss of consciousness, weakness and headaches.  Endo/Heme/Allergies:  Negative for environmental allergies. Does not bruise/bleed easily.  Psychiatric/Behavioral:  Negative for depression and suicidal ideas. The patient is not nervous/anxious and does not have insomnia.       Objective:    Physical Exam Vitals and nursing note reviewed.  Constitutional:      General: She is not in acute distress.    Appearance: Normal appearance. She is well-developed. She is not ill-appearing.  HENT:     Head: Normocephalic and atraumatic.     Right Ear: External ear normal.     Left Ear: External ear normal.  Eyes:     Extraocular Movements: Extraocular movements intact.     Conjunctiva/sclera: Conjunctivae normal.     Pupils: Pupils are equal, round, and reactive to light.  Neck:     Thyroid: No thyromegaly.     Vascular: No carotid bruit or JVD.  Cardiovascular:     Rate and Rhythm: Normal rate and regular rhythm.     Heart sounds: Normal heart sounds. No murmur heard.   No gallop.  Pulmonary:     Effort: Pulmonary effort is normal. No respiratory distress.     Breath sounds: Examination of the left-upper field reveals decreased breath sounds. Decreased breath sounds present. No wheezing or rales.  Chest:     Chest wall: No tenderness.  Musculoskeletal:     Cervical back: Normal range of motion and neck supple.  Skin:    General: Skin is warm and dry.  Neurological:     Mental Status: She is alert and oriented to person, place, and time.  Psychiatric:        Behavior: Behavior normal.        Judgment: Judgment normal.    BP (!) 140/100 (BP Location: Left Arm, Patient Position:  Sitting, Cuff Size: Normal)   Pulse 77   Temp 99.4 F (37.4 C) (Oral)   Resp 18   Ht 5\' 5"  (1.651 m)   Wt 208 lb 3.2 oz (94.4 kg)   SpO2 97%   BMI 34.65 kg/m  Wt Readings from Last 3 Encounters:  11/04/20 208 lb 3.2 oz (94.4 kg)  09/19/20 207 lb (93.9 kg)  06/30/20 208 lb (94.3 kg)    Diabetic Foot Exam - Simple   No data filed    Lab Results  Component Value Date   WBC 6.7 02/11/2020   HGB 13.4 02/11/2020   HCT 39.3 02/11/2020   PLT 321.0 02/11/2020   GLUCOSE 99 09/19/2020   CHOL 267 (H) 09/19/2020   TRIG 109.0 09/19/2020  HDL 57.40 09/19/2020   LDLDIRECT 151.9 11/06/2012   LDLCALC 188 (H) 09/19/2020   ALT 15 09/19/2020   AST 16 09/19/2020   NA 140 09/19/2020   K 3.5 09/19/2020   CL 101 09/19/2020   CREATININE 0.89 09/19/2020   BUN 16 09/19/2020   CO2 33 (H) 09/19/2020   TSH 1.61 04/26/2019   MICROALBUR 1.2 11/06/2012    Lab Results  Component Value Date   TSH 1.61 04/26/2019   Lab Results  Component Value Date   WBC 6.7 02/11/2020   HGB 13.4 02/11/2020   HCT 39.3 02/11/2020   MCV 86.9 02/11/2020   PLT 321.0 02/11/2020   Lab Results  Component Value Date   NA 140 09/19/2020   K 3.5 09/19/2020   CO2 33 (H) 09/19/2020   GLUCOSE 99 09/19/2020   BUN 16 09/19/2020   CREATININE 0.89 09/19/2020   BILITOT 0.7 09/19/2020   ALKPHOS 75 09/19/2020   AST 16 09/19/2020   ALT 15 09/19/2020   PROT 6.8 09/19/2020   ALBUMIN 4.0 09/19/2020   CALCIUM 9.7 09/19/2020   GFR 70.39 09/19/2020   Lab Results  Component Value Date   CHOL 267 (H) 09/19/2020   Lab Results  Component Value Date   HDL 57.40 09/19/2020   Lab Results  Component Value Date   LDLCALC 188 (H) 09/19/2020   Lab Results  Component Value Date   TRIG 109.0 09/19/2020   Lab Results  Component Value Date   CHOLHDL 5 09/19/2020   No results found for: HGBA1C     Assessment & Plan:   Problem List Items Addressed This Visit       Unprioritized   Bronchospasm   Relevant  Medications   predniSONE (DELTASONE) 10 MG tablet   Influenza    Finish tamiflu Fluids and rest rto prn       Pneumonia of left upper lobe due to infectious organism - Primary    Finish abx  Changed the steroid to pred taper --- pt had headache with dexamethasone  Repeat cxr        Relevant Medications   promethazine-dextromethorphan (PROMETHAZINE-DM) 6.25-15 MG/5ML syrup   Other Relevant Orders   DG Chest 2 View (Completed)   CBC with Differential/Platelet   Comprehensive metabolic panel     Meds ordered this encounter  Medications   promethazine-dextromethorphan (PROMETHAZINE-DM) 6.25-15 MG/5ML syrup    Sig: Take 5 mLs by mouth 4 (four) times daily as needed.    Dispense:  118 mL    Refill:  0   predniSONE (DELTASONE) 10 MG tablet    Sig: TAKE 3 TABLETS PO QD FOR 3 DAYS THEN TAKE 2 TABLETS PO QD FOR 3 DAYS THEN TAKE 1 TABLET PO QD FOR 3 DAYS THEN TAKE 1/2 TAB PO QD FOR 3 DAYS    Dispense:  20 tablet    Refill:  0   methylPREDNISolone acetate (DEPO-MEDROL) injection 80 mg    I, Seabron Spates R, personally preformed the services described in this documentation.  All medical record entries made by the scribe were at my direction and in my presence.  I have reviewed the chart and discharge instructions (if applicable) and agree that the record reflects my personal performance and is accurate and complete. 11/04/2020   I,Diana Randall,acting as a scribe for Diana Schultz, DO.,have documented all relevant documentation on the behalf of Diana Schultz, DO,as directed by  Diana Schultz, DO while in the presence  of Diana Schultz, DO.   Diana Schultz, DO

## 2020-11-12 ENCOUNTER — Other Ambulatory Visit: Payer: Self-pay | Admitting: Family Medicine

## 2020-11-12 DIAGNOSIS — J189 Pneumonia, unspecified organism: Secondary | ICD-10-CM

## 2020-12-03 ENCOUNTER — Other Ambulatory Visit: Payer: Self-pay

## 2020-12-03 ENCOUNTER — Other Ambulatory Visit (INDEPENDENT_AMBULATORY_CARE_PROVIDER_SITE_OTHER): Payer: BC Managed Care – PPO

## 2020-12-03 DIAGNOSIS — J189 Pneumonia, unspecified organism: Secondary | ICD-10-CM

## 2020-12-03 LAB — CBC WITH DIFFERENTIAL/PLATELET
Basophils Absolute: 0.1 10*3/uL (ref 0.0–0.1)
Basophils Relative: 0.6 % (ref 0.0–3.0)
Eosinophils Absolute: 0.2 10*3/uL (ref 0.0–0.7)
Eosinophils Relative: 2 % (ref 0.0–5.0)
HCT: 42.1 % (ref 36.0–46.0)
Hemoglobin: 14.1 g/dL (ref 12.0–15.0)
Lymphocytes Relative: 33 % (ref 12.0–46.0)
Lymphs Abs: 2.9 10*3/uL (ref 0.7–4.0)
MCHC: 33.5 g/dL (ref 30.0–36.0)
MCV: 89.1 fl (ref 78.0–100.0)
Monocytes Absolute: 0.4 10*3/uL (ref 0.1–1.0)
Monocytes Relative: 5 % (ref 3.0–12.0)
Neutro Abs: 5.2 10*3/uL (ref 1.4–7.7)
Neutrophils Relative %: 59.4 % (ref 43.0–77.0)
Platelets: 316 10*3/uL (ref 150.0–400.0)
RBC: 4.72 Mil/uL (ref 3.87–5.11)
RDW: 13.9 % (ref 11.5–15.5)
WBC: 8.7 10*3/uL (ref 4.0–10.5)

## 2020-12-18 DIAGNOSIS — Z6834 Body mass index (BMI) 34.0-34.9, adult: Secondary | ICD-10-CM | POA: Diagnosis not present

## 2020-12-18 DIAGNOSIS — Z01419 Encounter for gynecological examination (general) (routine) without abnormal findings: Secondary | ICD-10-CM | POA: Diagnosis not present

## 2020-12-19 LAB — HM PAP SMEAR: HM Pap smear: NORMAL

## 2021-02-11 ENCOUNTER — Encounter: Payer: Self-pay | Admitting: Family Medicine

## 2021-02-12 ENCOUNTER — Telehealth: Payer: Self-pay | Admitting: Family Medicine

## 2021-02-12 NOTE — Telephone Encounter (Signed)
Noted  

## 2021-02-12 NOTE — Telephone Encounter (Signed)
After pt was transferred to triage, an access nurse called back and stated pt needed to be seen in office within 24 hours. He was informed we do not have anything until tomorrow, and only one slot open. He said that would be out of the window of time. After he discussed with pt she told him she would take that appt. Pt was transferred back to our office, and by that time the last slot was filled. She was reminded that an access nurse recommend she needed to be seen within 24 hours and offered our [facility] locations. She denied and said she would wait until next week, she was scheduled 1/3 with edward. Please advise.

## 2021-02-12 NOTE — Telephone Encounter (Signed)
Appt scheduled 02/17/21.

## 2021-02-12 NOTE — Telephone Encounter (Signed)
Nurse Assessment Nurse: Oretha Ellis, RN, Will Date/Time Diana Randall Time): 02/12/2021 9:17:08 AM Confirm and document reason for call. If symptomatic, describe symptoms. ---Caller reports that she is lightheaded and woozy. Symptoms noticeable with position changes. Onset of symptoms on Saturday 12/24. She reports that she has had vertigo in the past but this does not feel like a strong room spinning sensation. Does the patient have any new or worsening symptoms? ---Yes Will a triage be completed? ---Yes Related visit to physician within the last 2 weeks? ---No Does the PT have any chronic conditions? (i.e. diabetes, asthma, this includes High risk factors for pregnancy, etc.) ---Yes List chronic conditions. ---vertigo, htn, high cholesterol, Is this a behavioral health or substance abuse call? ---No Guidelines Guideline Title Affirmed Question Affirmed Notes Nurse Date/Time (Eastern Time) Dizziness - Lightheadedness [1] MODERATE dizziness (e.g., interferes with normal activities) AND [2] has NOT been evaluated by physician for this Oretha Ellis, RN, Will 02/12/2021 9:20:51 AM PLEASE NOTE: All timestamps contained within this report are represented as [place] Standard Time. CONFIDENTIALTY NOTICE: This fax transmission is intended only for the addressee. It contains information that is legally privileged, confidential or otherwise protected from use or disclosure. If you are not the intended recipient, you are strictly prohibited from reviewing, disclosing, copying using or disseminating any of this information or taking any action in reliance on or regarding this information. If you have received this fax in error, please notify us immediately by telephone so that we can arrange for its return to [place]. Phone: [phone], Toll-Free: [phone], Fax: [phone] Page: 2 of 2 Call Id: [id number] Guidelines Guideline Title Affirmed Question Affirmed Notes Nurse Date/Time  Diana Randall Time) (Exception: dizziness caused by heat exposure, sudden standing, or poor fluid intake) Disp. Time Diana Randall Time) Disposition Final User 02/12/2021 9:32:14 AM See PCP within 24 Hours Yes Oretha Ellis, RN, Will Caller Disagree/Comply Disagree Caller Understands Yes PreDisposition Did not know what to do Care Advice Given Per Guideline * IF OFFICE WILL BE OPEN: You need to be examined within the next 24 hours. Call your doctor (or NP/PA) when the office opens and make an appointment. SEE PCP WITHIN 24 HOURS: DRINK FLUIDS: CALL BACK IF: * Passes out (faints) * You become worse CARE ADVICE given per Dizziness (Adult) guideline. Comments User: Diana Cranker, RN Date/Time (Eastern Time): 02/12/2021 9:22:44 AM blood pressures in 130-80 range which she reports as normal for her. User: Diana Cranker, RN Date/Time Diana Randall Time): 02/12/2021 9:34:27 AM caller advised of 24 hour outcome. no office appointments available in next 24 hours pre office staff. Caller reports that she will take next available appointment at 3:20 pm on 12/30. Referrals REFERRED TO PCP OFFICE

## 2021-02-12 NOTE — Telephone Encounter (Signed)
Pt stated she has been having dizziness. She is have trouble keeping her balance and standing up and wanted to schedule an appointment with dr. Laury Axon. She was informed that dr Laury Axon first available would not be until the end of next week, and since we have no availability with any other providers today or tomorrow, she was transferred to triage.

## 2021-02-17 ENCOUNTER — Ambulatory Visit: Payer: BC Managed Care – PPO | Admitting: Medical

## 2021-02-17 VITALS — BP 122/80 | HR 81 | Temp 98.5°F | Resp 18 | Ht 65.0 in | Wt 212.2 lb

## 2021-02-17 DIAGNOSIS — E785 Hyperlipidemia, unspecified: Secondary | ICD-10-CM | POA: Diagnosis not present

## 2021-02-17 DIAGNOSIS — H9193 Unspecified hearing loss, bilateral: Secondary | ICD-10-CM

## 2021-02-17 DIAGNOSIS — R42 Dizziness and giddiness: Secondary | ICD-10-CM

## 2021-02-17 DIAGNOSIS — H43391 Other vitreous opacities, right eye: Secondary | ICD-10-CM

## 2021-02-17 DIAGNOSIS — I951 Orthostatic hypotension: Secondary | ICD-10-CM | POA: Diagnosis not present

## 2021-02-17 LAB — CBC WITH DIFFERENTIAL/PLATELET
Basophils Absolute: 0 10*3/uL (ref 0.0–0.1)
Basophils Relative: 0.7 % (ref 0.0–3.0)
Eosinophils Absolute: 0.1 10*3/uL (ref 0.0–0.7)
Eosinophils Relative: 1.5 % (ref 0.0–5.0)
HCT: 41 % (ref 36.0–46.0)
Hemoglobin: 13.7 g/dL (ref 12.0–15.0)
Lymphocytes Relative: 38.1 % (ref 12.0–46.0)
Lymphs Abs: 2.5 10*3/uL (ref 0.7–4.0)
MCHC: 33.4 g/dL (ref 30.0–36.0)
MCV: 89.5 fl (ref 78.0–100.0)
Monocytes Absolute: 0.4 10*3/uL (ref 0.1–1.0)
Monocytes Relative: 5.5 % (ref 3.0–12.0)
Neutro Abs: 3.5 10*3/uL (ref 1.4–7.7)
Neutrophils Relative %: 54.2 % (ref 43.0–77.0)
Platelets: 281 10*3/uL (ref 150.0–400.0)
RBC: 4.58 Mil/uL (ref 3.87–5.11)
RDW: 13.3 % (ref 11.5–15.5)
WBC: 6.5 10*3/uL (ref 4.0–10.5)

## 2021-02-17 LAB — COMPREHENSIVE METABOLIC PANEL
ALT: 15 U/L (ref 0–35)
AST: 20 U/L (ref 0–37)
Albumin: 4 g/dL (ref 3.5–5.2)
Alkaline Phosphatase: 75 U/L (ref 39–117)
BUN: 12 mg/dL (ref 6–23)
CO2: 30 mEq/L (ref 19–32)
Calcium: 8.9 mg/dL (ref 8.4–10.5)
Chloride: 102 mEq/L (ref 96–112)
Creatinine, Ser: 0.73 mg/dL (ref 0.40–1.20)
GFR: 89.03 mL/min (ref >60.00)
Glucose, Bld: 72 mg/dL (ref 70–99)
Potassium: 3.4 mEq/L — ABNORMAL LOW (ref 3.5–5.1)
Sodium: 140 mEq/L (ref 135–145)
Total Bilirubin: 0.7 mg/dL (ref 0.2–1.2)
Total Protein: 6.8 g/dL (ref 6.0–8.3)

## 2021-02-17 LAB — LIPID PANEL
Cholesterol: 248 mg/dL — ABNORMAL HIGH (ref 0–200)
HDL: 53.6 mg/dL (ref >39.00)
NonHDL: 194.85
Total CHOL/HDL Ratio: 5
Triglycerides: 209 mg/dL — ABNORMAL HIGH (ref 0.0–149.0)
VLDL: 41.8 mg/dL — ABNORMAL HIGH (ref 0.0–40.0)

## 2021-02-17 NOTE — Patient Instructions (Addendum)
Dizziness on and off most prominent on position changes. Recommend getting balance. Would ask check bp daily and let me know readings in one week.   Will get cbc and cmp today as well.  Will get lipid panel today.  If you get vertigo like symptoms lasting for more than 5 minutes can use meclizine. Also if experiencing vertigo good follow Epley maneuver guidelines but have family member with you if you do exercise  If any dizziness with motor or sensory function deficits then be seen in ED for imaging studies.  New floater since November. If you have worse features as describe let me know then get you in with your optometrist. Dramatic quick changes then be seen in ED.  Follow up in 2-3 weeks or sooner if needed

## 2021-02-17 NOTE — Progress Notes (Signed)
Subjective:    Patient ID: Diana Randall, female    DOB: [birth year], 61 y.o.   MRN: [id number]  HPI  Pt in for evaluation.  She states she is having some episodes of dizziness for 3 weeks on and off.   She noticed pattern of getting dizziness/light headed on changing position going from sitting to standing. She notes also when turns he head will feel off balance.   When she stand light headed for a couple of seconds.   She states Thursday when taking down her christmas tree with up and down motion would be worse. Last night putting up some pots in her kitchen had some dizziness. Usually will last for 2-5 seconds at most.  With these episodes no gross motor or sensory deficits.   Cousin of her had tear in her ear drum that caused her to have dizziness.  Pt highest bp at home was 130/92. Weekends check twice a day. On weekdays checks every other day.   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 Pt last saw optomtrist in summer. They told her can occur with age. Same features. Not new features. No light flashing. She got opinion from Dr. Hazle Quant  Review of Systems  Constitutional:  Negative for chills and fatigue.  HENT:  Negative for dental problem.   Respiratory:  Negative for cough, chest tightness, shortness of breath and wheezing.   Cardiovascular:  Negative for chest pain and palpitations.  Gastrointestinal:  Negative for abdominal pain, blood in stool and diarrhea.  Genitourinary:  Negative for dyspareunia and dysuria.  Musculoskeletal:  Negative for back pain, myalgias and neck pain.  Skin:  Negative for rash.  Neurological:  Positive for dizziness. Negative for syncope, facial asymmetry, speech difficulty, weakness and numbness.  Hematological:  Negative for adenopathy. Does not bruise/bleed easily.  Psychiatric/Behavioral:  Negative for behavioral problems and confusion.     Past Medical History:  Diagnosis Date   GERD (gastroesophageal  reflux disease)    Hyperlipidemia    Hypertension      Social History   Socioeconomic History   Marital status: Married    Spouse name: Not on file   Number of children: Not on file   Years of education: Not on file   Highest education level: Not on file  Occupational History   Not on file  Tobacco Use   Smoking status: Never   Smokeless tobacco: Never  Substance and Sexual Activity   Alcohol use: Yes   Drug use: No   Sexual activity: Not Currently    Partners: Male  Other Topics Concern   Not on file  Social History Narrative   Not on file   Social Determinants of Health   Financial Resource Strain: Not on file  Food Insecurity: Not on file  Transportation Needs: Not on file  Physical Activity: Not on file  Stress: Not on file  Social Connections: Not on file  Intimate Partner Violence: Not on file    No past surgical history on file.  Family History  Problem Relation Age of Onset   Stomach cancer Brother     Allergies  Allergen Reactions   Latex Rash    Current Outpatient Medications on File Prior to Visit  Medication Sig Dispense Refill   amLODipine (NORVASC) 10 MG tablet Take 1 tablet (10 mg total) by mouth daily. 90 tablet 1   fluticasone (FLONASE) 50 MCG/ACT nasal spray Place 2 sprays into both nostrils daily. 16 g  1   olmesartan-hydrochlorothiazide (BENICAR HCT) 40-25 MG tablet Take 1 tablet by mouth daily. 90 tablet 1   pantoprazole (PROTONIX) 40 MG tablet Take 1 tablet (40 mg total) by mouth 2 (two) times daily before a meal. 180 tablet 3   potassium chloride (KLOR-CON) 10 MEQ tablet 2 po qd 180 tablet 1   rosuvastatin (CRESTOR) 20 MG tablet Take 1 tablet (20 mg total) by mouth daily. 90 tablet 1   No current facility-administered medications on file prior to visit.    BP 122/80 (BP Location: Left Arm, Patient Position: Sitting, Cuff Size: Large)    Pulse 81    Temp 98.5 F (36.9 C) (Oral)    Resp 18    Ht 5\' 5"  (1.651 m)    Wt 212 lb 3.2 oz  (96.3 kg)    SpO2 100%    BMI 35.31 kg/m       Objective:   Physical Exam  General Mental Status- Alert. General Appearance- Not in acute distress.   Skin General: Color- Normal Color. Moisture- Normal Moisture.  Neck Carotid Arteries- Normal color. Moisture- Normal Moisture. No carotid bruits. No JVD.  Chest and Lung Exam Auscultation: Breath Sounds:-Normal.  Cardiovascular Auscultation:Rythm- Regular. Murmurs & Other Heart Sounds:Auscultation of the heart reveals- No Murmurs.  Abdomen Inspection:-Inspeection Normal. Palpation/Percussion:Note:No mass. Palpation and Percussion of the abdomen reveal- Non Tender, Non Distended + BS, no rebound or guarding.    Neurologic Cranial Nerve exam:- CN III-XII intact(No nystagmus), symmetric smile. Drift Test:- No drift. Romberg Exam:- Negative.  Heal to Toe Gait exam:-Normal. Finger to Nose:- Normal/Intact Strength:- 5/5 equal and symmetric strength both upper and lower extremities.  Laying suping and turning head faint dizziness for a second -two right, left and some midline. Some mild dizziness on changing position.       Assessment & Plan:   Patient Instructions  Dizziness on and off most prominent on position changes. Recommend getting balance. Would ask check bp daily and let me know readings in one week.   Will get cbc and cmp today as well.  Will get lipid panel today.  If you get vertigo like symptoms lasting for more than 5 minutes can use meclizine. Also if experiencing vertigo good follow Epley maneuver guidelines but have family member with you if you do exercise  If any dizziness with motor or sensory function deficits then be seen in ED for imaging studies.  New floater since November. If you have worse features as describe let me know then get you in with your optometrist. Dramatic quick changes then be seen in ED.  Follow up in 2-3 weeks or sooner if needed    December, PA-C

## 2021-02-18 LAB — LDL CHOLESTEROL, DIRECT: Direct LDL: 177 mg/dL

## 2021-03-05 DIAGNOSIS — H903 Sensorineural hearing loss, bilateral: Secondary | ICD-10-CM | POA: Diagnosis not present

## 2021-07-03 DIAGNOSIS — K219 Gastro-esophageal reflux disease without esophagitis: Secondary | ICD-10-CM | POA: Diagnosis not present

## 2021-07-03 DIAGNOSIS — K5792 Diverticulitis of intestine, part unspecified, without perforation or abscess without bleeding: Secondary | ICD-10-CM | POA: Diagnosis not present

## 2021-07-03 DIAGNOSIS — K2900 Acute gastritis without bleeding: Secondary | ICD-10-CM | POA: Diagnosis not present

## 2021-07-03 DIAGNOSIS — R1084 Generalized abdominal pain: Secondary | ICD-10-CM | POA: Diagnosis not present

## 2021-07-06 ENCOUNTER — Ambulatory Visit: Payer: BC Managed Care – PPO | Admitting: Family Medicine

## 2021-07-06 ENCOUNTER — Ambulatory Visit: Payer: BC Managed Care – PPO | Admitting: Medical

## 2021-07-06 VITALS — BP 136/80 | HR 70 | Resp 18 | Ht 65.0 in | Wt 208.2 lb

## 2021-07-06 DIAGNOSIS — K5792 Diverticulitis of intestine, part unspecified, without perforation or abscess without bleeding: Secondary | ICD-10-CM

## 2021-07-06 DIAGNOSIS — R1032 Left lower quadrant pain: Secondary | ICD-10-CM

## 2021-07-06 LAB — CBC WITH DIFFERENTIAL/PLATELET
Basophils Absolute: 0 10*3/uL (ref 0.0–0.1)
Basophils Relative: 0.8 % (ref 0.0–3.0)
Eosinophils Absolute: 0.1 10*3/uL (ref 0.0–0.7)
Eosinophils Relative: 1.7 % (ref 0.0–5.0)
HCT: 40.9 % (ref 36.0–46.0)
Hemoglobin: 13.6 g/dL (ref 12.0–15.0)
Lymphocytes Relative: 41.3 % (ref 12.0–46.0)
Lymphs Abs: 2.3 10*3/uL (ref 0.7–4.0)
MCHC: 33.4 g/dL (ref 30.0–36.0)
MCV: 88.5 fl (ref 78.0–100.0)
Monocytes Absolute: 0.2 10*3/uL (ref 0.1–1.0)
Monocytes Relative: 3.8 % (ref 3.0–12.0)
Neutro Abs: 3 10*3/uL (ref 1.4–7.7)
Neutrophils Relative %: 52.4 % (ref 43.0–77.0)
Platelets: 333 10*3/uL (ref 150.0–400.0)
RBC: 4.62 Mil/uL (ref 3.87–5.11)
RDW: 13.2 % (ref 11.5–15.5)
WBC: 5.7 10*3/uL (ref 4.0–10.5)

## 2021-07-06 LAB — COMPREHENSIVE METABOLIC PANEL
ALT: 13 U/L (ref 0–35)
AST: 17 U/L (ref 0–37)
Albumin: 4 g/dL (ref 3.5–5.2)
Alkaline Phosphatase: 78 U/L (ref 39–117)
BUN: 15 mg/dL (ref 6–23)
CO2: 33 mEq/L — ABNORMAL HIGH (ref 19–32)
Calcium: 10.1 mg/dL (ref 8.4–10.5)
Chloride: 101 mEq/L (ref 96–112)
Creatinine, Ser: 0.93 mg/dL (ref 0.40–1.20)
GFR: 66.4 mL/min (ref >60.00)
Glucose, Bld: 106 mg/dL — ABNORMAL HIGH (ref 70–99)
Potassium: 3.7 mEq/L (ref 3.5–5.1)
Sodium: 139 mEq/L (ref 135–145)
Total Bilirubin: 0.5 mg/dL (ref 0.2–1.2)
Total Protein: 7.2 g/dL (ref 6.0–8.3)

## 2021-07-06 NOTE — Patient Instructions (Signed)
Left lower quadrant area pain/likely diverticulitis. Continue cipro and flagyl.  Will get cbc and cmp stat today.  Will get ct abd pelvis. Attempt to do prior authorization. Number to radiology [phone].  For recent flare reflux continue protonix and famotadine.  Follow up date to be determined after lab review. Hopefully will have results of ct abd/pelvis before you head out of town on Thursday.

## 2021-07-06 NOTE — Progress Notes (Signed)
Subjective:    Patient ID: Diana Randall, female    DOB: Apr 24, 1960, 61 y.o.   MRN: [id number]  HPI  Pt in with constant nagging pain in abdomen on left side abdomen for past 10 days. Pt states at first on and off pain along with upset stomach and some loose stool.   Pt states went to [facility] and dx with diverticulitis. Also her reflux flared.  Pt went to [facility]. She was placed on flagyl 500 mg twice daily and cipro 500 mg twice daily.  Her reflux has flared recently as well. Was on pantaprozole in past and UC added famotadine. She does feel better.  She also has virtual appt with her GI MD next month August 03, 2021.    In addition states work is more stressful.    She states flagyl and cipro on Saturday. Feeling some better. Early on with abdomen pain she thought may have had chills.  Hx of diverticulitis twice in past. Also on colonoscopy in past known diverticulosis in area where has recent symptoms.    Review of Systems  Constitutional:  Negative for chills, fatigue and fever.  Respiratory:  Negative for cough, chest tightness, shortness of breath and wheezing.   Cardiovascular:  Negative for chest pain and palpitations.  Gastrointestinal:  Positive for abdominal pain. Negative for abdominal distention, constipation and nausea.  Musculoskeletal:  Negative for back pain.  Neurological:  Negative for dizziness and light-headedness.  Hematological:  Negative for adenopathy.  Psychiatric/Behavioral:  Negative for behavioral problems and confusion.     Past Medical History:  Diagnosis Date   GERD (gastroesophageal reflux disease)    Hyperlipidemia    Hypertension      Social History   Socioeconomic History   Marital status: Married    Spouse name: Not on file   Number of children: Not on file   Years of education: Not on file   Highest education level: Not on file  Occupational History   Not on file  Tobacco Use   Smoking status: Never   Smokeless tobacco: Never   Substance and Sexual Activity   Alcohol use: Yes   Drug use: No   Sexual activity: Not Currently    Partners: Male  Other Topics Concern   Not on file  Social History Narrative   Not on file   Social Determinants of Health   Financial Resource Strain: Not on file  Food Insecurity: Not on file  Transportation Needs: Not on file  Physical Activity: Not on file  Stress: Not on file  Social Connections: Not on file  Intimate Partner Violence: Not on file    No past surgical history on file.  Family History  Problem Relation Age of Onset   Stomach cancer Brother     Allergies  Allergen Reactions   Latex Rash    Current Outpatient Medications on File Prior to Visit  Medication Sig Dispense Refill   amLODipine (NORVASC) 10 MG tablet Take 1 tablet (10 mg total) by mouth daily. 90 tablet 1   fluticasone (FLONASE) 50 MCG/ACT nasal spray Place 2 sprays into both nostrils daily. 16 g 1   olmesartan-hydrochlorothiazide (BENICAR HCT) 40-25 MG tablet Take 1 tablet by mouth daily. 90 tablet 1   pantoprazole (PROTONIX) 40 MG tablet Take 1 tablet (40 mg total) by mouth 2 (two) times daily before a meal. 180 tablet 3   potassium chloride (KLOR-CON) 10 MEQ tablet 2 po qd 180 tablet 1   rosuvastatin (CRESTOR)  20 MG tablet Take 1 tablet (20 mg total) by mouth daily. 90 tablet 1   No current facility-administered medications on file prior to visit.    BP 136/80   Pulse 70   Resp 18   Ht 5\' 5"  (1.651 m)   Wt 208 lb 3.2 oz (94.4 kg)   SpO2 98%   BMI 34.65 kg/m        Objective:   Physical Exam  General Mental Status- Alert. General Appearance- Not in acute distress.   Skin General: Color- Normal Color. Moisture- Normal Moisture.  Neck Carotid Arteries- Normal color. Moisture- Normal Moisture. No carotid bruits. No JVD.  Chest and Lung Exam Auscultation: Breath Sounds:-Normal.  Cardiovascular Auscultation:Rythm- Regular. Murmurs & Other Heart Sounds:Auscultation of  the heart reveals- No Murmurs.  Abdomen Inspection:-Inspeection Normal. Palpation/Percussion:Note:No mass. Palpation and Percussion of the abdomen reveal- mild-moderate left lower quadrant Tender, Non Distended + BS, no rebound or guarding.   Neurologic Cranial Nerve exam:- CN III-XII intact(No nystagmus), symmetric smile. Strength:- 5/5 equal and symmetric strength both upper and lower extremities.       Assessment & Plan:   Patient Instructions  Left lower quadrant area pain/likely diverticulitis. Continue cipro and flagyl.  Will get cbc and cmp stat today.  Will get ct abd pelvis. Attempt to do prior authorization. Number to radiology [phone].  For recent flare reflux continue protonix and famotadine.  Follow up date to be determined after lab review. Hopefully will have results of ct abd/pelvis before you head out of town on Thursday.    Sunday, PA-C

## 2021-07-07 ENCOUNTER — Ambulatory Visit (HOSPITAL_BASED_OUTPATIENT_CLINIC_OR_DEPARTMENT_OTHER)
Admission: RE | Admit: 2021-07-07 | Discharge: 2021-07-07 | Disposition: A | Discharge status: Home / Self Care | Payer: BC Managed Care – PPO | Source: Ambulatory Visit | Attending: Medical | Admitting: Medical

## 2021-07-07 DIAGNOSIS — R1032 Left lower quadrant pain: Secondary | ICD-10-CM | POA: Insufficient documentation

## 2021-07-07 DIAGNOSIS — K5792 Diverticulitis of intestine, part unspecified, without perforation or abscess without bleeding: Secondary | ICD-10-CM | POA: Diagnosis not present

## 2021-07-07 MED ORDER — IOHEXOL 300 MG/ML  SOLN
100.0000 mL | Freq: Once | INTRAMUSCULAR | Status: AC | PRN
  Administered 2021-07-07: 100 mL via INTRAVENOUS

## 2021-07-08 ENCOUNTER — Encounter: Payer: Self-pay | Admitting: Medical

## 2021-07-16 ENCOUNTER — Ambulatory Visit: Payer: BC Managed Care – PPO | Admitting: Medical

## 2021-07-16 VITALS — BP 142/85 | HR 78 | Resp 18 | Ht 65.0 in | Wt 206.8 lb

## 2021-07-16 DIAGNOSIS — R1032 Left lower quadrant pain: Secondary | ICD-10-CM | POA: Diagnosis not present

## 2021-07-16 DIAGNOSIS — K5792 Diverticulitis of intestine, part unspecified, without perforation or abscess without bleeding: Secondary | ICD-10-CM | POA: Diagnosis not present

## 2021-07-16 LAB — COMPREHENSIVE METABOLIC PANEL
ALT: 21 U/L (ref 0–35)
AST: 23 U/L (ref 0–37)
Albumin: 3.8 g/dL (ref 3.5–5.2)
Alkaline Phosphatase: 66 U/L (ref 39–117)
BUN: 12 mg/dL (ref 6–23)
CO2: 31 mEq/L (ref 19–32)
Calcium: 9.2 mg/dL (ref 8.4–10.5)
Chloride: 102 mEq/L (ref 96–112)
Creatinine, Ser: 0.9 mg/dL (ref 0.40–1.20)
GFR: 69.05 mL/min (ref >60.00)
Glucose, Bld: 89 mg/dL (ref 70–99)
Potassium: 3.7 mEq/L (ref 3.5–5.1)
Sodium: 141 mEq/L (ref 135–145)
Total Bilirubin: 0.5 mg/dL (ref 0.2–1.2)
Total Protein: 6.2 g/dL (ref 6.0–8.3)

## 2021-07-16 LAB — CBC WITH DIFFERENTIAL/PLATELET
Basophils Absolute: 0 10*3/uL (ref 0.0–0.1)
Basophils Relative: 0.6 % (ref 0.0–3.0)
Eosinophils Absolute: 0 10*3/uL (ref 0.0–0.7)
Eosinophils Relative: 0.8 % (ref 0.0–5.0)
HCT: 41.4 % (ref 36.0–46.0)
Hemoglobin: 13.9 g/dL (ref 12.0–15.0)
Lymphocytes Relative: 42.1 % (ref 12.0–46.0)
Lymphs Abs: 1.8 10*3/uL (ref 0.7–4.0)
MCHC: 33.6 g/dL (ref 30.0–36.0)
MCV: 87.5 fl (ref 78.0–100.0)
Monocytes Absolute: 0.3 10*3/uL (ref 0.1–1.0)
Monocytes Relative: 6.8 % (ref 3.0–12.0)
Neutro Abs: 2.1 10*3/uL (ref 1.4–7.7)
Neutrophils Relative %: 49.7 % (ref 43.0–77.0)
Platelets: 269 10*3/uL (ref 150.0–400.0)
RBC: 4.73 Mil/uL (ref 3.87–5.11)
RDW: 13.9 % (ref 11.5–15.5)
WBC: 4.2 10*3/uL (ref 4.0–10.5)

## 2021-07-16 LAB — LIPASE: Lipase: 60 U/L — ABNORMAL HIGH (ref 11.0–59.0)

## 2021-07-16 MED ORDER — CIPROFLOXACIN HCL 500 MG PO TABS: 500.0000 mg | ORAL_TABLET | Freq: Two times a day (BID) | ORAL | 0 refills | Status: DC

## 2021-07-16 MED ORDER — METRONIDAZOLE 500 MG PO TABS: 500.0000 mg | ORAL_TABLET | Freq: Three times a day (TID) | ORAL | 0 refills | Status: AC

## 2021-07-16 NOTE — Patient Instructions (Signed)
Recent diverticulitis by CT abd/pelvis. Much improved while on antibiotic then signs/symptoms flared after antibiotic finished. Get cbc, cmp and lipase stat. Restart both cipro and flagyl.  Give me update tomorrow how you feel. If worse or wbc count elevated will try to repeat CT abd/pelvis to see if you developed complications.  Will try to get your appointment with GI moved up. If unable to and symptoms worsen then may refer to [facility] MD.  For gerd continue protonix and famotadine.  Follow up Monday or tuesday

## 2021-07-16 NOTE — Progress Notes (Unsigned)
Subjective:     Patient ID: Diana Randall, female   DOB: 04-Dec-1960, 61 y.o.   MRN: [id number]  HPI  Pt in for follow up form diverticulitis.  Pt states she did well up until Monday when she ate salad and her left side abdomen pain returned. She states vomited on Tuesday. She had some subjective fever and chills. Her gerd/reflux also flared. Her symptoms occurred as she finished her cipro and flagyl.  For reflux. Pt is on protonix an famotadine.  Pt started to feel some better yesterday. Some returning appetite since yesterday.  See last note and ct abd/pelvis.  Pt has appointment with GI MD August 03, 2021. This was scheduled virtual with partner of Dr. Kinnie Scales.(Dr. Kinnie Scales is retiring)  Pt scheduled to see Dr. Veleta Miners. [phone].   Review of Systems  Constitutional:  Negative for chills, fatigue and fever.  Respiratory:  Negative for cough, chest tightness, shortness of breath and wheezing.   Cardiovascular:  Negative for chest pain and palpitations.  Gastrointestinal:  Positive for abdominal pain. Negative for constipation, diarrhea, nausea and vomiting.  Musculoskeletal:  Negative for back pain and myalgias.  Skin:  Negative for rash.  Neurological:  Negative for dizziness, speech difficulty, numbness and headaches.  Psychiatric/Behavioral:  Negative for behavioral problems and confusion.     Past Medical History:  Diagnosis Date   GERD (gastroesophageal reflux disease)    Hyperlipidemia    Hypertension      Social History   Socioeconomic History   Marital status: Married    Spouse name: Not on file   Number of children: Not on file   Years of education: Not on file   Highest education level: Not on file  Occupational History   Not on file  Tobacco Use   Smoking status: Never   Smokeless tobacco: Never  Substance and Sexual Activity   Alcohol use: Yes   Drug use: No   Sexual activity: Not Currently    Partners: Male  Other Topics Concern   Not on file   Social History Narrative   Not on file   Social Determinants of Health   Financial Resource Strain: Not on file  Food Insecurity: Not on file  Transportation Needs: Not on file  Physical Activity: Not on file  Stress: Not on file  Social Connections: Not on file  Intimate Partner Violence: Not on file    No past surgical history on file.  Family History  Problem Relation Age of Onset   Stomach cancer Brother     Allergies  Allergen Reactions   Latex Rash    Current Outpatient Medications on File Prior to Visit  Medication Sig Dispense Refill   amLODipine (NORVASC) 10 MG tablet Take 1 tablet (10 mg total) by mouth daily. 90 tablet 1   fluticasone (FLONASE) 50 MCG/ACT nasal spray Place 2 sprays into both nostrils daily. 16 g 1   olmesartan-hydrochlorothiazide (BENICAR HCT) 40-25 MG tablet Take 1 tablet by mouth daily. 90 tablet 1   pantoprazole (PROTONIX) 40 MG tablet Take 1 tablet (40 mg total) by mouth 2 (two) times daily before a meal. 180 tablet 3   potassium chloride (KLOR-CON) 10 MEQ tablet 2 po qd 180 tablet 1   rosuvastatin (CRESTOR) 20 MG tablet Take 1 tablet (20 mg total) by mouth daily. 90 tablet 1   No current facility-administered medications on file prior to visit.    BP (!) 142/85   Pulse 78   Resp 18  Ht 5\' 5"  (1.651 m)   Wt 206 lb 12.8 oz (93.8 kg)   SpO2 99%   BMI 34.41 kg/m       Objective:   Physical Exam   General- No acute distress. Pleasant patient. Neck- Full range of motion, no jvd Lungs- Clear, even and unlabored. Heart- regular rate and rhythm. Neurologic- CNII- XII grossly intact.  Abdomen-soft, nd, +bs, left lower quadrant region tenderness moderate. No rebound or guarding. No organomegaly. Back- no cva tenderness.     Assessment:   Patient Instructions  Recent diverticulitis by CT abd/pelvis. Much improved while on antibiotic then signs/symptoms flared after antibiotic finished. Get cbc, cmp and lipase stat. Restart both  cipro and flagyl.  Give me update tomorrow how you feel. If worse or wbc count elevated will try to repeat CT abd/pelvis to see if you developed complications.  Will try to get your appointment with GI moved up. If unable to and symptoms worsen then may refer to [facility] [facility] MD.  For gerd continue protonix and famotadine.  Follow up Monday or tuesday   Sunday, PA-C  Plan:

## 2021-07-21 ENCOUNTER — Ambulatory Visit: Payer: BC Managed Care – PPO | Admitting: Medical

## 2021-07-21 ENCOUNTER — Ambulatory Visit: Payer: BC Managed Care – PPO | Admitting: Family Medicine

## 2021-07-21 VITALS — BP 139/78 | HR 84 | Temp 98.2°F | Resp 18 | Ht 65.0 in | Wt 208.0 lb

## 2021-07-21 DIAGNOSIS — R1032 Left lower quadrant pain: Secondary | ICD-10-CM

## 2021-07-21 DIAGNOSIS — K5792 Diverticulitis of intestine, part unspecified, without perforation or abscess without bleeding: Secondary | ICD-10-CM | POA: Diagnosis not present

## 2021-07-21 NOTE — Patient Instructions (Signed)
Resolved left side abdomen pain with second round of antibiotics. No imaging or lab studies needed presently. Recommend bland healthy diet. Pending appointment with GI recommend low threshold to restart additional round if pain again flares pending August 03, 2021 appointment with GI.  We can recheck you in office as well if needed prior to appt. Follow up as regularly scheduled with pcp.

## 2021-07-21 NOTE — Progress Notes (Signed)
Subjective:    Patient ID: Diana Randall, female    DOB: [birth year], 61 y.o.   MRN: [id number]  HPI  Pt in for follow up.  Pt states feels a lot better. Pt is still on cipro and flagyl. She is finishing up on tabs tomorrow. Pt did try to get her gi MD appointment move up but could not. She is scheduled for August 03, 2021.    Last wbc was not elevated on labs done on past visit.  On last labs only one point lipase elevation at time of lab. Pt has no abdomen pain or nausea.    Review of Systems  Constitutional:  Negative for chills, fatigue and fever.  Respiratory:  Negative for cough, chest tightness and shortness of breath.   Cardiovascular:  Negative for chest pain and palpitations.  Gastrointestinal:  Negative for anal bleeding, constipation, diarrhea, nausea and rectal pain.  Musculoskeletal:  Negative for back pain.  Skin:  Negative for pallor and rash.    Past Medical History:  Diagnosis Date   GERD (gastroesophageal reflux disease)    Hyperlipidemia    Hypertension      Social History   Socioeconomic History   Marital status: Married    Spouse name: Not on file   Number of children: Not on file   Years of education: Not on file   Highest education level: Not on file  Occupational History   Not on file  Tobacco Use   Smoking status: Never   Smokeless tobacco: Never  Substance and Sexual Activity   Alcohol use: Yes   Drug use: No   Sexual activity: Not Currently    Partners: Male  Other Topics Concern   Not on file  Social History Narrative   Not on file   Social Determinants of Health   Financial Resource Strain: Not on file  Food Insecurity: Not on file  Transportation Needs: Not on file  Physical Activity: Not on file  Stress: Not on file  Social Connections: Not on file  Intimate Partner Violence: Not on file    No past surgical history on file.  Family History  Problem Relation Age of Onset   Stomach cancer Brother     Allergies   Allergen Reactions   Latex Rash    Current Outpatient Medications on File Prior to Visit  Medication Sig Dispense Refill   amLODipine (NORVASC) 10 MG tablet Take 1 tablet (10 mg total) by mouth daily. 90 tablet 1   ciprofloxacin (CIPRO) 500 MG tablet Take 1 tablet (500 mg total) by mouth 2 (two) times daily. 14 tablet 0   fluticasone (FLONASE) 50 MCG/ACT nasal spray Place 2 sprays into both nostrils daily. 16 g 1   metroNIDAZOLE (FLAGYL) 500 MG tablet Take 1 tablet (500 mg total) by mouth 3 (three) times daily for 7 days. 21 tablet 0   olmesartan-hydrochlorothiazide (BENICAR HCT) 40-25 MG tablet Take 1 tablet by mouth daily. 90 tablet 1   pantoprazole (PROTONIX) 40 MG tablet Take 1 tablet (40 mg total) by mouth 2 (two) times daily before a meal. 180 tablet 3   potassium chloride (KLOR-CON) 10 MEQ tablet 2 po qd 180 tablet 1   rosuvastatin (CRESTOR) 20 MG tablet Take 1 tablet (20 mg total) by mouth daily. 90 tablet 1   No current facility-administered medications on file prior to visit.    BP 139/78   Pulse 84   Temp 98.2 F (36.8 C)   Resp 18  Ht 5\' 5"  (1.651 m)   Wt 208 lb (94.3 kg)   SpO2 100%   BMI 34.61 kg/m       Objective:   Physical Exam  General Appearance- Not in acute distress.  HEENT Eyes- Scleraeral/Conjuntiva-bilat- Not Yellow. Mouth & Throat- Normal.  Chest and Lung Exam Auscultation: Breath sounds:-Normal. Adventitious sounds:- No Adventitious sounds.  Cardiovascular Auscultation:Rythm - Regular. Heart Sounds -Normal heart sounds.  Abdomen Inspection:-Inspection Normal.  Palpation/Perucssion: Palpation and Percussion of the abdomen reveal- Non Tender, No Rebound tenderness, No rigidity(Guarding) and No Palpable abdominal masses.  Liver:-Normal.  Spleen:- Normal.        Assessment & Plan:   Patient Instructions  Resolved left side abdomen pain with second round of antibiotics. No imaging or lab studies needed presently. Recommend bland  healthy diet. Pending appointment with GI recommend low threshold to restart additional round if pain again flares pending August 03, 2021 appointment with GI.  We can recheck you in office as well if needed prior to appt. Follow up as regularly scheduled with pcp.   Mackie Pai, PA-C

## 2021-08-03 DIAGNOSIS — K219 Gastro-esophageal reflux disease without esophagitis: Secondary | ICD-10-CM | POA: Diagnosis not present

## 2021-08-03 DIAGNOSIS — Z8719 Personal history of other diseases of the digestive system: Secondary | ICD-10-CM | POA: Diagnosis not present

## 2021-08-03 DIAGNOSIS — K449 Diaphragmatic hernia without obstruction or gangrene: Secondary | ICD-10-CM | POA: Diagnosis not present

## 2021-08-03 DIAGNOSIS — K225 Diverticulum of esophagus, acquired: Secondary | ICD-10-CM | POA: Diagnosis not present

## 2021-08-20 DIAGNOSIS — Z1231 Encounter for screening mammogram for malignant neoplasm of breast: Secondary | ICD-10-CM | POA: Diagnosis not present

## 2021-08-20 LAB — HM MAMMOGRAPHY

## 2021-12-03 ENCOUNTER — Encounter: Payer: Self-pay | Admitting: Family Medicine

## 2021-12-03 ENCOUNTER — Ambulatory Visit: Payer: BC Managed Care – PPO | Admitting: Family Medicine

## 2021-12-03 VITALS — BP 139/83 | HR 91 | Temp 97.8°F | Ht 65.0 in | Wt 209.8 lb

## 2021-12-03 DIAGNOSIS — J069 Acute upper respiratory infection, unspecified: Secondary | ICD-10-CM | POA: Diagnosis not present

## 2021-12-03 LAB — POC COVID19 BINAXNOW: SARS Coronavirus 2 Ag: NEGATIVE

## 2021-12-03 MED ORDER — GUAIFENESIN ER 600 MG PO TB12: 600.0000 mg | ORAL_TABLET | Freq: Two times a day (BID) | ORAL | 0 refills | Status: DC

## 2021-12-03 NOTE — Progress Notes (Signed)
Established Patient Office Visit  Subjective   Patient ID: Diana Randall, female    DOB: [birth year]  Age: 61 y.o. MRN: [id number]  Chief Complaint  Patient presents with   Sinus Problem    Pt c/o sinus pressure sore throat headache started yesterday.    Sinus Problem Associated symptoms include chills, congestion and headaches. Pertinent negatives include no coughing, shortness of breath or sore throat.   1 day history of URI signs and symptoms to include headache, nasal congestion, postnasal drip, sore throat, facial pressure and teeth pain in the left maxillary sinus area.  Scant rhinorrhea.  Denies fevers chills, cough difficulty breathing nausea or vomiting myalgias arthralgias.    Review of Systems  Constitutional:  Positive for chills and malaise/fatigue. Negative for fever.  HENT:  Positive for congestion. Negative for sore throat.   Eyes:  Negative for blurred vision, discharge and redness.  Respiratory: Negative.  Negative for cough, shortness of breath and wheezing.   Cardiovascular: Negative.   Gastrointestinal:  Negative for abdominal pain.  Genitourinary: Negative.   Musculoskeletal: Negative.  Negative for joint pain and myalgias.  Skin:  Negative for rash.  Neurological:  Positive for headaches. Negative for tingling, loss of consciousness and weakness.  Endo/Heme/Allergies:  Negative for polydipsia.      Objective:     BP (!) 130/90 (BP Location: Right Arm, Patient Position: Sitting, Cuff Size: Normal)   Pulse 91   Temp 97.8 F (36.6 C) (Temporal)   Ht 5\' 5"  (1.651 m)   Wt 209 lb 12.8 oz (95.2 kg)   SpO2 97%   BMI 34.91 kg/m    Physical Exam Constitutional:      General: She is not in acute distress.    Appearance: Normal appearance. She is not ill-appearing, toxic-appearing or diaphoretic.  HENT:     Head: Normocephalic and atraumatic.     Right Ear: External ear normal.     Left Ear: External ear normal.     Mouth/Throat:     Mouth: Mucous  membranes are moist.     Pharynx: Oropharynx is clear. No oropharyngeal exudate or posterior oropharyngeal erythema.  Eyes:     General: No scleral icterus.       Right eye: No discharge.        Left eye: No discharge.     Extraocular Movements: Extraocular movements intact.     Conjunctiva/sclera: Conjunctivae normal.     Pupils: Pupils are equal, round, and reactive to light.  Cardiovascular:     Rate and Rhythm: Normal rate and regular rhythm.  Pulmonary:     Effort: Pulmonary effort is normal. No respiratory distress.     Breath sounds: Normal breath sounds. No wheezing or rales.  Musculoskeletal:     Cervical back: No rigidity or tenderness.  Skin:    General: Skin is warm and dry.  Neurological:     Mental Status: She is alert and oriented to person, place, and time.  Psychiatric:        Mood and Affect: Mood normal.        Behavior: Behavior normal.      Results for orders placed or performed in visit on 12/03/21  POC COVID-19  Result Value Ref Range   SARS Coronavirus 2 Ag Negative Negative      The ASCVD Risk score (Arnett DK, et al., 2019) failed to calculate for the following reasons:   Unable to determine if patient is Non-Hispanic African American  Assessment & Plan:   Problem List Items Addressed This Visit       Respiratory   Upper respiratory tract infection - Primary   Relevant Medications   guaiFENesin (MUCINEX) 600 MG 12 hr tablet   Other Relevant Orders   POC COVID-19 (Completed)    Return in about 1 week (around 12/10/2021), or if symptoms worsen or fail to improve.  We will start Mucinex and restart Flonase.  Advised her to drink a lot of water with Mucinex to help liquefy the secretions.  Information was given on viral  Libby Maw, MD

## 2021-12-23 DIAGNOSIS — Z01419 Encounter for gynecological examination (general) (routine) without abnormal findings: Secondary | ICD-10-CM | POA: Diagnosis not present

## 2021-12-23 DIAGNOSIS — Z6835 Body mass index (BMI) 35.0-35.9, adult: Secondary | ICD-10-CM | POA: Diagnosis not present

## 2022-02-10 DIAGNOSIS — M25562 Pain in left knee: Secondary | ICD-10-CM | POA: Diagnosis not present

## 2022-02-11 IMAGING — CT CT ABD-PELV W/ CM
2 of 5 series · 17 of 46 positions shown, 19 images · IV contrast (Omnipaque)
Comparison: 11/05/2014

CLINICAL DATA: Diverticulitis, left lower quadrant pain

EXAM:
CT ABDOMEN AND PELVIS WITH CONTRAST
TECHNIQUE: Multidetector CT imaging of the abdomen and pelvis was performed
using the standard protocol following bolus administration of
intravenous contrast.
CONTRAST:  100mL OMNIPAQUE IOHEXOL 300 MG/ML  SOLN

[Series 2: axial st · axial · 0.78mm/px · z∈[-478,-54]mm · 14 of 95 slices shown, 16 images]
[im 5/95  soft-tissue]
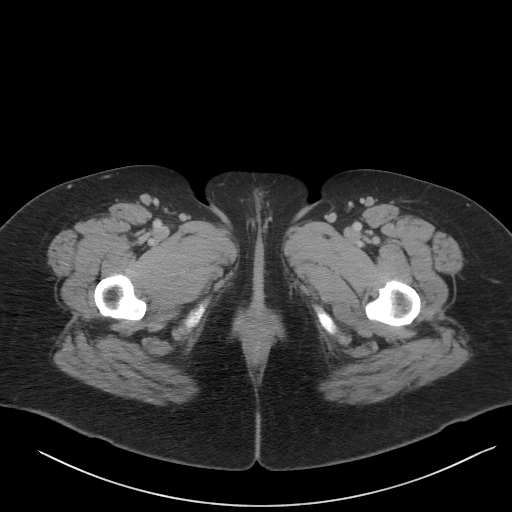
[im 5/95  bone]
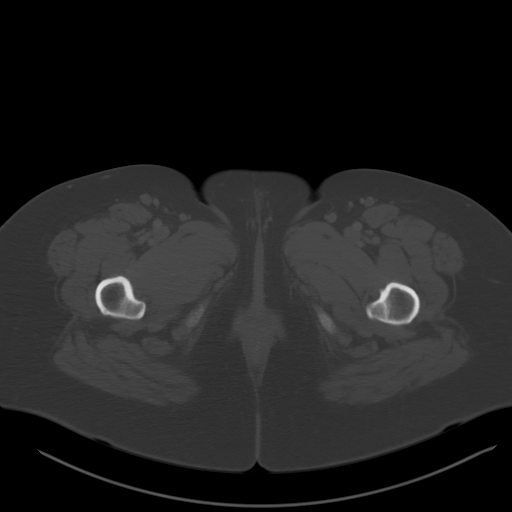
[im 15/95  soft-tissue]
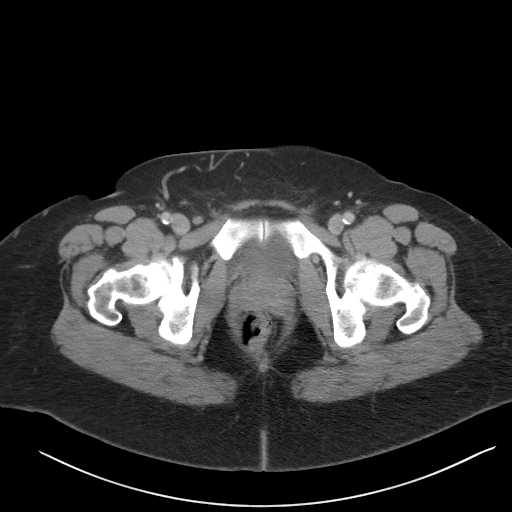
[im 19/95  soft-tissue]
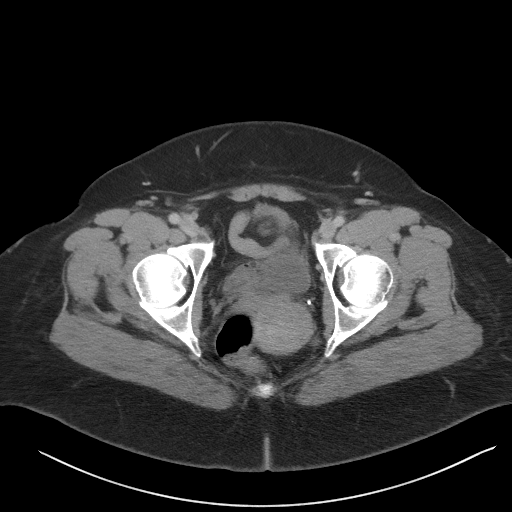
[im 24/95  soft-tissue]
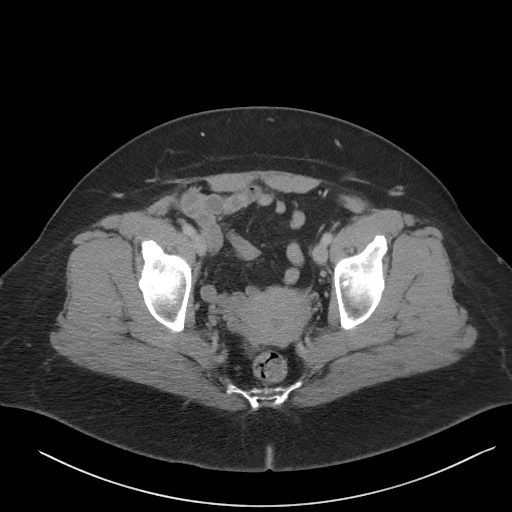
[im 33/95  soft-tissue]
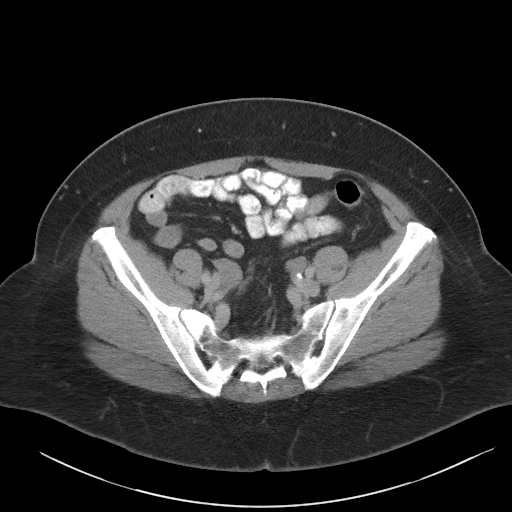
[im 38/95  soft-tissue]
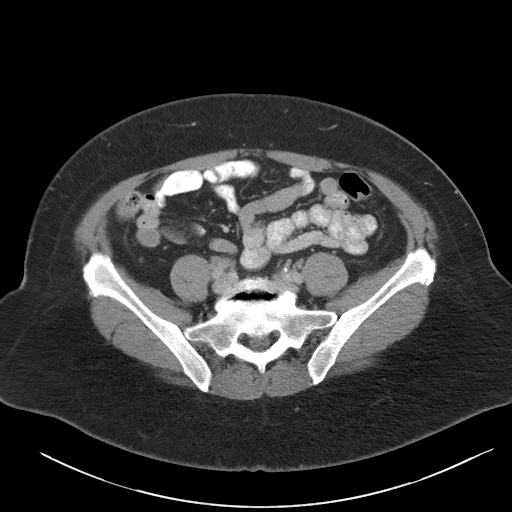
[im 43/95  soft-tissue]
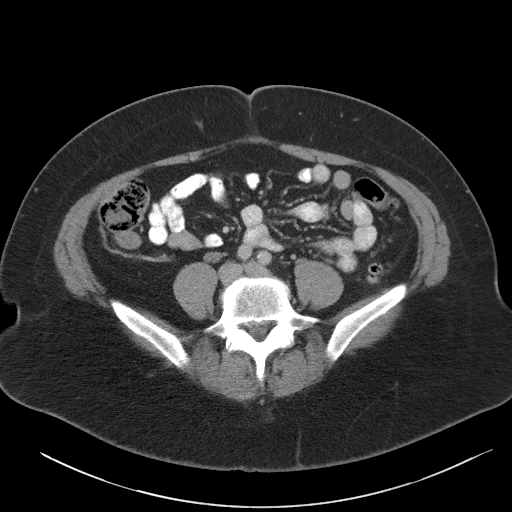
[im 52/95  soft-tissue]
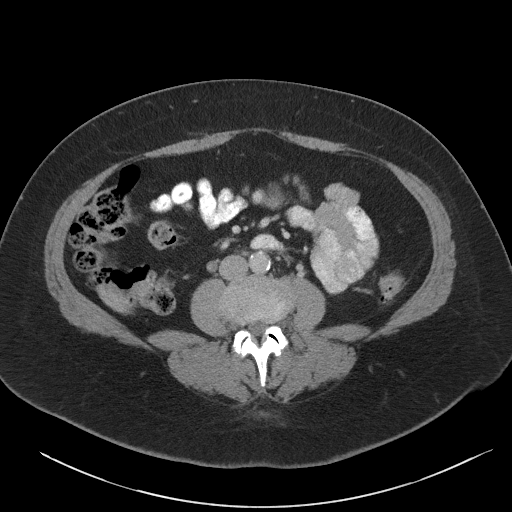
[im 57/95  soft-tissue]
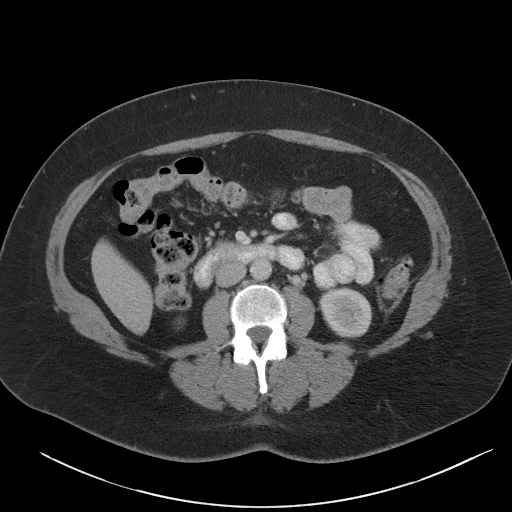
[im 57/95  bone]
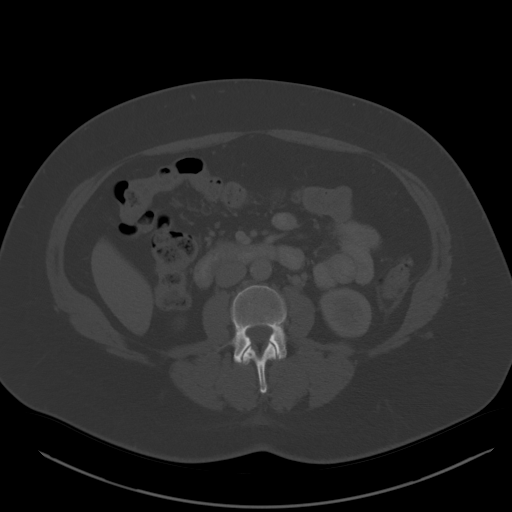
[im 62/95  soft-tissue]
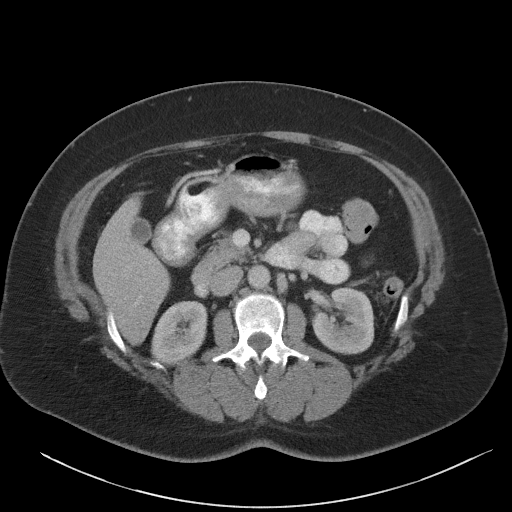
[im 71/95  soft-tissue]
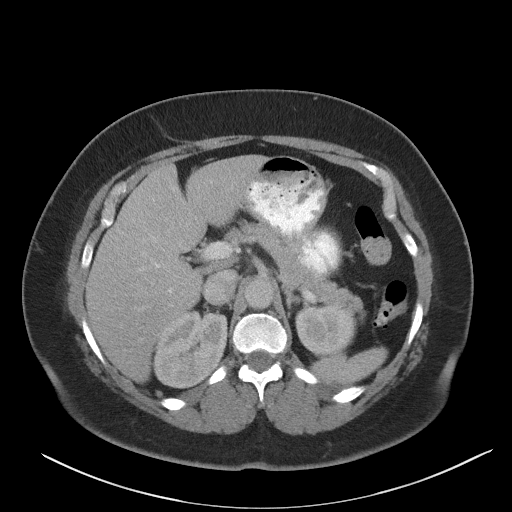
[im 76/95  soft-tissue]
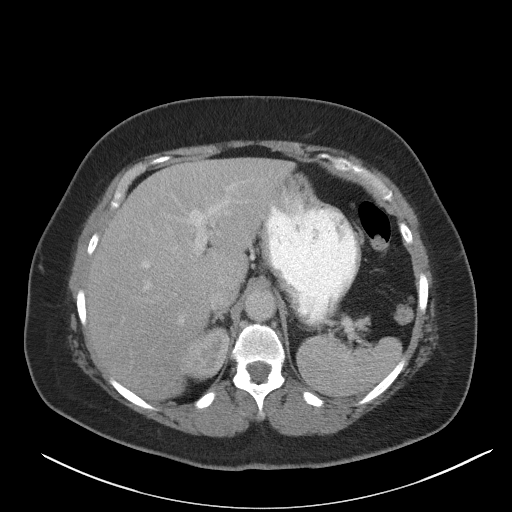
[im 80/95  soft-tissue]
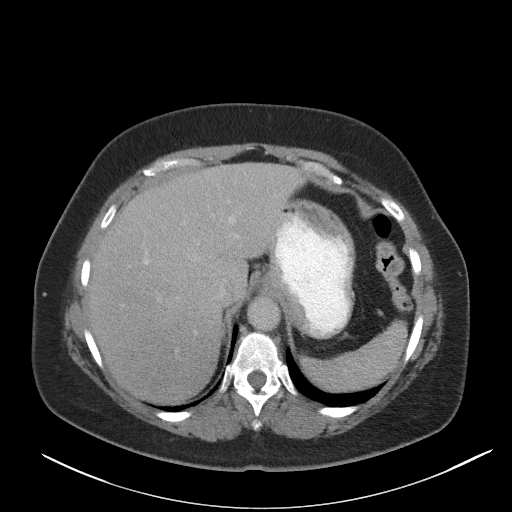
[im 90/95  soft-tissue]
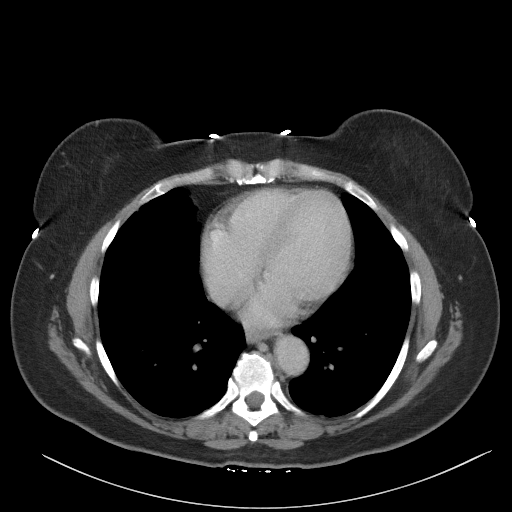

[Series 5: coronal st · coronal · 0.87mm/px · 3 of 91 slices shown]
[im 31/91  soft-tissue]
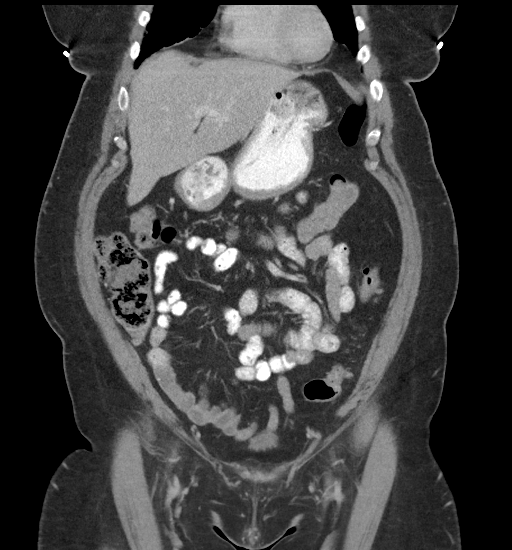
[im 41/91  soft-tissue]
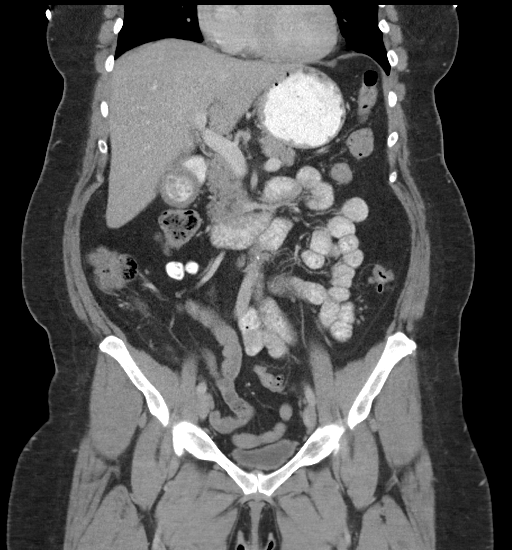
[im 51/91  soft-tissue]
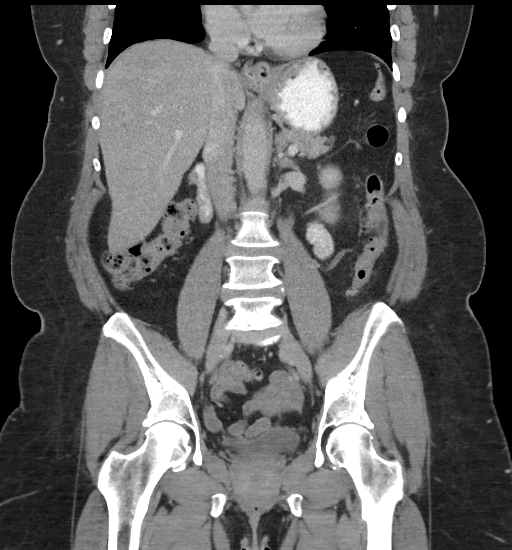

[17 of 46 positions shown; findings below may reference images not displayed]

FINDINGS: Lower chest: Lung bases are clear. No effusions. Heart is normal
size.

Hepatobiliary: No focal hepatic abnormality. Gallbladder
unremarkable.

Pancreas: No focal abnormality or ductal dilatation.

Spleen: No focal abnormality.  Normal size.

Adrenals/Urinary Tract: No adrenal abnormality. No focal renal
abnormality. No stones or hydronephrosis. Urinary bladder is
unremarkable.

Stomach/Bowel: Descending colonic and sigmoid diverticulosis.
Inflammatory stranding noted around the mid descending colon
compatible with active diverticulitis. No bowel obstruction.

Vascular/Lymphatic: Aortic atherosclerosis. No evidence of aneurysm
or adenopathy.

Reproductive: Uterus and adnexa unremarkable.  No mass.

Other: No free fluid or free air.

Musculoskeletal: No acute bony abnormality
IMPRESSION: Left colonic diverticulosis. Inflammatory stranding around the mid
descending colon compatible with active diverticulitis. No
complicating feature.

Scattered aortic atherosclerosis.

## 2022-03-10 DIAGNOSIS — J069 Acute upper respiratory infection, unspecified: Secondary | ICD-10-CM | POA: Diagnosis not present

## 2022-03-10 DIAGNOSIS — R110 Nausea: Secondary | ICD-10-CM | POA: Diagnosis not present

## 2022-03-10 DIAGNOSIS — R5381 Other malaise: Secondary | ICD-10-CM | POA: Diagnosis not present

## 2022-03-10 DIAGNOSIS — R051 Acute cough: Secondary | ICD-10-CM | POA: Diagnosis not present

## 2022-03-10 DIAGNOSIS — R42 Dizziness and giddiness: Secondary | ICD-10-CM | POA: Diagnosis not present

## 2022-03-10 DIAGNOSIS — H6593 Unspecified nonsuppurative otitis media, bilateral: Secondary | ICD-10-CM | POA: Diagnosis not present

## 2022-03-10 DIAGNOSIS — R509 Fever, unspecified: Secondary | ICD-10-CM | POA: Diagnosis not present

## 2022-04-07 ENCOUNTER — Ambulatory Visit: Payer: BC Managed Care – PPO | Admitting: Medical

## 2022-04-07 ENCOUNTER — Other Ambulatory Visit: Payer: Self-pay | Admitting: Medical

## 2022-04-07 VITALS — BP 150/80 | HR 85 | Temp 98.2°F | Resp 18 | Ht 65.0 in | Wt 208.6 lb

## 2022-04-07 DIAGNOSIS — R5383 Other fatigue: Secondary | ICD-10-CM

## 2022-04-07 DIAGNOSIS — H9202 Otalgia, left ear: Secondary | ICD-10-CM | POA: Diagnosis not present

## 2022-04-07 DIAGNOSIS — J029 Acute pharyngitis, unspecified: Secondary | ICD-10-CM | POA: Diagnosis not present

## 2022-04-07 DIAGNOSIS — R059 Cough, unspecified: Secondary | ICD-10-CM

## 2022-04-07 LAB — POCT RAPID STREP A (OFFICE): Rapid Strep A Screen: NEGATIVE

## 2022-04-07 LAB — POCT INFLUENZA A/B
Influenza A, POC: NEGATIVE
Influenza B, POC: NEGATIVE

## 2022-04-07 MED ORDER — BENZONATATE 100 MG PO CAPS: 100.0000 mg | ORAL_CAPSULE | Freq: Three times a day (TID) | ORAL | 0 refills | Status: DC | PRN

## 2022-04-07 MED ORDER — FLUTICASONE PROPIONATE 50 MCG/ACT NA SUSP: 2.0000 | Freq: Every day | NASAL | 1 refills | Status: DC

## 2022-04-07 NOTE — Patient Instructions (Addendum)
Early uri type signs/symptom for one day with mild pharyngitis and ear  mild discomfort/pain. Upcoming trip.   Covid test negative this morning. Flu and strep were both negative.  Flonase nasal spray and benzonate cough.   If your throat pain worsen or ear pain worsens before Friday let me know. In that event would give antibiotic. Currenlty not indicated.  Follow up in 7-10 days or sooner if needed

## 2022-04-07 NOTE — Progress Notes (Signed)
Subjective:    Patient ID: Diana Randall, female    DOB: [birth year], 62 y.o.   MRN: [id number]  HPI Pt in reporting that recently had acute onset nasal congestion, st, slight dizzy, fever, ear plug  and chills. Pt tooks covid test this morning.   No myalgias or arthralgias.  Plans to travel on Saturday. Traveling with in laws.  Pt took some tylenol yesterday. No other treatment.     Review of Systems  Constitutional:  Negative for chills, fatigue and fever.  HENT:  Positive for congestion, sinus pressure, sinus pain and sore throat. Negative for ear discharge.        Mild ear pressure/pain  Respiratory:  Positive for cough. Negative for choking, shortness of breath and wheezing.   Cardiovascular:  Negative for chest pain and palpitations.  Gastrointestinal:  Negative for abdominal pain and constipation.  Genitourinary:  Negative for dysuria and urgency.  Musculoskeletal:  Negative for back pain and joint swelling.       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 She is following up with [facility]. Saw them December.  Neurological:  Negative for dizziness and headaches.  Hematological:  Negative for adenopathy. Does not bruise/bleed easily.  Psychiatric/Behavioral:  Negative for behavioral problems and confusion.     Past Medical History:  Diagnosis Date   GERD (gastroesophageal reflux disease)    Hyperlipidemia    Hypertension      Social History   Socioeconomic History   Marital status: Married    Spouse name: Not on file   Number of children: Not on file   Years of education: Not on file   Highest education level: Not on file  Occupational History   Not on file  Tobacco Use   Smoking status: Never   Smokeless tobacco: Never  Substance and Sexual Activity   Alcohol use: Yes   Drug use: No   Sexual activity: Not Currently    Partners: Male  Other Topics Concern   Not on file  Social History Narrative   Not on file   Social Determinants  of Health   Financial Resource Strain: Not on file  Food Insecurity: Not on file  Transportation Needs: Not on file  Physical Activity: Not on file  Stress: Not on file  Social Connections: Not on file  Intimate Partner Violence: Not on file    No past surgical history on file.  Family History  Problem Relation Age of Onset   Stomach cancer Brother     Allergies  Allergen Reactions   Latex Rash    Current Outpatient Medications on File Prior to Visit  Medication Sig Dispense Refill   albuterol (VENTOLIN HFA) 108 (90 Base) MCG/ACT inhaler      amLODipine (NORVASC) 10 MG tablet Take 1 tablet (10 mg total) by mouth daily. 90 tablet 1   ciprofloxacin (CIPRO) 500 MG tablet Take 1 tablet (500 mg total) by mouth 2 (two) times daily. 14 tablet 0   famotidine (PEPCID) 20 MG tablet Take 20 mg by mouth 2 (two) times daily.     fluticasone (FLONASE) 50 MCG/ACT nasal spray Place 2 sprays into both nostrils daily. 16 g 1   guaiFENesin (MUCINEX) 600 MG 12 hr tablet Take 1 tablet (600 mg total) by mouth 2 (two) times daily. 20 tablet 0   olmesartan-hydrochlorothiazide (BENICAR HCT) 40-25 MG tablet Take 1 tablet by mouth daily. 90 tablet 1   omeprazole (PRILOSEC) 40 MG capsule Take  40 mg by mouth 2 (two) times daily.     pantoprazole (PROTONIX) 40 MG tablet Take 1 tablet (40 mg total) by mouth 2 (two) times daily before a meal. 180 tablet 3   potassium chloride (KLOR-CON) 10 MEQ tablet 2 po qd 180 tablet 1   rosuvastatin (CRESTOR) 20 MG tablet Take 1 tablet (20 mg total) by mouth daily. 90 tablet 1   No current facility-administered medications on file prior to visit.    BP (!) 150/80   Pulse 85   Temp 98.2 F (36.8 C)   Resp 18   Ht 5' 5"$  (1.651 m)   Wt 208 lb 9.6 oz (94.6 kg)   SpO2 98%   BMI 34.71 kg/m        Objective:   Physical Exam  General Mental Status- Alert. General Appearance- Not in acute distress.   Skin General: Color- Normal Color. Moisture- Normal  Moisture.  Neck Carotid Arteries- Normal color. Moisture- Normal Moisture. No carotid bruits. No JVD.  Chest and Lung Exam Auscultation: Breath Sounds:-Normal.  Cardiovascular Auscultation:Rythm- Regular. Murmurs & Other Heart Sounds:Auscultation of the heart reveals- No Murmurs.   Neurologic Cranial Nerve exam:- CN III-XII intact(No nystagmus), symmetric smile. Drift Test:- No drift. Romberg Exam:- Negative.  Heal to Toe Gait exam:-Normal. Finger to Nose:- Normal/Intact Strength:- 5/5 equal and symmetric strength both upper and lower extremities.   Heent- no sinus pressure. Rt ear- canal clear and tm normal. Left- canal clear. Tm faint dull.  Pharynx- faint mid red uvula. No tonsil hypertrophy.      Assessment & Plan:   Patient Instructions  Early uri type signs/symptom for one day with mild pharyngitis and ear  mild discomfort/pain. Upcoming trip.   Covid test negative this morning. Flu and strep were both negative.  Flonase nasal spray and benzonate cough.   If your throat pain worsen or ear pain worsens before Friday let me know. In that event would give antibiotic. Currenlty not indicated.  Follow up in 7-10 days or sooner if needed   [employer]

## 2022-07-06 ENCOUNTER — Encounter: Payer: Self-pay | Admitting: Family Medicine

## 2022-07-06 DIAGNOSIS — I10 Essential (primary) hypertension: Secondary | ICD-10-CM

## 2022-07-06 MED ORDER — AMLODIPINE BESYLATE 10 MG PO TABS: 10.0000 mg | ORAL_TABLET | Freq: Every day | ORAL | 0 refills | Status: DC

## 2022-07-06 MED ORDER — OLMESARTAN MEDOXOMIL-HCTZ 40-25 MG PO TABS: 1.0000 | ORAL_TABLET | Freq: Every day | ORAL | 0 refills | Status: DC

## 2022-07-07 ENCOUNTER — Telehealth: Payer: Self-pay | Admitting: Family Medicine

## 2022-07-07 DIAGNOSIS — E785 Hyperlipidemia, unspecified: Secondary | ICD-10-CM

## 2022-07-07 DIAGNOSIS — I10 Essential (primary) hypertension: Secondary | ICD-10-CM

## 2022-07-07 MED ORDER — OLMESARTAN MEDOXOMIL-HCTZ 40-25 MG PO TABS: 1.0000 | ORAL_TABLET | Freq: Every day | ORAL | 0 refills | Status: DC

## 2022-07-07 MED ORDER — ROSUVASTATIN CALCIUM 20 MG PO TABS: 20.0000 mg | ORAL_TABLET | Freq: Every day | ORAL | 0 refills | Status: DC

## 2022-07-07 MED ORDER — AMLODIPINE BESYLATE 10 MG PO TABS: 10.0000 mg | ORAL_TABLET | Freq: Every day | ORAL | 0 refills | Status: DC

## 2022-07-07 NOTE — Telephone Encounter (Signed)
Pt has physical scheduled for 08/20/22 but would like to have refills on her  BP and cholesterol meds

## 2022-07-07 NOTE — Telephone Encounter (Signed)
Refills sent

## 2022-07-07 NOTE — Addendum Note (Signed)
Addended by: Roxanne Gates on: 07/07/2022 10:49 AM   Modules accepted: Orders

## 2022-08-20 ENCOUNTER — Encounter: Payer: BC Managed Care – PPO | Admitting: Family Medicine

## 2022-08-24 ENCOUNTER — Telehealth: Payer: Self-pay

## 2022-08-24 ENCOUNTER — Other Ambulatory Visit: Payer: Self-pay | Admitting: Family Medicine

## 2022-08-24 ENCOUNTER — Encounter: Payer: Self-pay | Admitting: Family Medicine

## 2022-08-24 ENCOUNTER — Ambulatory Visit (INDEPENDENT_AMBULATORY_CARE_PROVIDER_SITE_OTHER): Payer: BC Managed Care – PPO | Admitting: Family Medicine

## 2022-08-24 VITALS — BP 134/86 | HR 86 | Temp 98.4°F | Resp 20 | Ht 65.0 in | Wt 210.0 lb

## 2022-08-24 DIAGNOSIS — Z1159 Encounter for screening for other viral diseases: Secondary | ICD-10-CM | POA: Diagnosis not present

## 2022-08-24 DIAGNOSIS — Z0000 Encounter for general adult medical examination without abnormal findings: Secondary | ICD-10-CM | POA: Diagnosis not present

## 2022-08-24 DIAGNOSIS — Z13220 Encounter for screening for lipoid disorders: Secondary | ICD-10-CM | POA: Diagnosis not present

## 2022-08-24 DIAGNOSIS — K219 Gastro-esophageal reflux disease without esophagitis: Secondary | ICD-10-CM

## 2022-08-24 DIAGNOSIS — I10 Essential (primary) hypertension: Secondary | ICD-10-CM

## 2022-08-24 DIAGNOSIS — E785 Hyperlipidemia, unspecified: Secondary | ICD-10-CM | POA: Diagnosis not present

## 2022-08-24 LAB — COMPREHENSIVE METABOLIC PANEL
ALT: 13 U/L (ref 0–35)
AST: 15 U/L (ref 0–37)
Albumin: 4 g/dL (ref 3.5–5.2)
Alkaline Phosphatase: 87 U/L (ref 39–117)
BUN: 14 mg/dL (ref 6–23)
CO2: 32 mEq/L (ref 19–32)
Calcium: 9.7 mg/dL (ref 8.4–10.5)
Chloride: 99 mEq/L (ref 96–112)
Creatinine, Ser: 0.83 mg/dL (ref 0.40–1.20)
GFR: 75.51 mL/min (ref >60.00)
Glucose, Bld: 111 mg/dL — ABNORMAL HIGH (ref 70–99)
Potassium: 3.5 mEq/L (ref 3.5–5.1)
Sodium: 138 mEq/L (ref 135–145)
Total Bilirubin: 0.9 mg/dL (ref 0.2–1.2)
Total Protein: 6.7 g/dL (ref 6.0–8.3)

## 2022-08-24 LAB — LIPID PANEL
Cholesterol: 231 mg/dL — ABNORMAL HIGH (ref 0–200)
HDL: 50.2 mg/dL (ref >39.00)
LDL Cholesterol: 159 mg/dL — ABNORMAL HIGH (ref 0–99)
NonHDL: 180.7
Total CHOL/HDL Ratio: 5
Triglycerides: 107 mg/dL (ref 0.0–149.0)
VLDL: 21.4 mg/dL (ref 0.0–40.0)

## 2022-08-24 LAB — CBC WITH DIFFERENTIAL/PLATELET
Basophils Absolute: 0.1 10*3/uL (ref 0.0–0.1)
Basophils Relative: 0.8 % (ref 0.0–3.0)
Eosinophils Absolute: 0.1 10*3/uL (ref 0.0–0.7)
Eosinophils Relative: 1.2 % (ref 0.0–5.0)
HCT: 40.7 % (ref 36.0–46.0)
Hemoglobin: 13.7 g/dL (ref 12.0–15.0)
Lymphocytes Relative: 31.9 % (ref 12.0–46.0)
Lymphs Abs: 2.1 10*3/uL (ref 0.7–4.0)
MCHC: 33.7 g/dL (ref 30.0–36.0)
MCV: 88.3 fl (ref 78.0–100.0)
Monocytes Absolute: 0.3 10*3/uL (ref 0.1–1.0)
Monocytes Relative: 4.6 % (ref 3.0–12.0)
Neutro Abs: 4.1 10*3/uL (ref 1.4–7.7)
Neutrophils Relative %: 61.5 % (ref 43.0–77.0)
Platelets: 292 10*3/uL (ref 150.0–400.0)
RBC: 4.61 Mil/uL (ref 3.87–5.11)
RDW: 13.5 % (ref 11.5–15.5)
WBC: 6.7 10*3/uL (ref 4.0–10.5)

## 2022-08-24 LAB — TSH: TSH: 2.2 u[IU]/mL (ref 0.35–5.50)

## 2022-08-24 MED ORDER — OLMESARTAN MEDOXOMIL-HCTZ 40-25 MG PO TABS: 1.0000 | ORAL_TABLET | Freq: Every day | ORAL | 0 refills | Status: DC

## 2022-08-24 MED ORDER — ZEPBOUND 2.5 MG/0.5ML ~~LOC~~ SOAJ
2.5000 mg | SUBCUTANEOUS | 0 refills | Status: DC
Start: 2022-08-24 — End: 2022-09-20

## 2022-08-24 MED ORDER — PANTOPRAZOLE SODIUM 40 MG PO TBEC: 40.0000 mg | DELAYED_RELEASE_TABLET | Freq: Two times a day (BID) | ORAL | 3 refills | Status: DC

## 2022-08-24 MED ORDER — OMEPRAZOLE 40 MG PO CPDR: 40.0000 mg | DELAYED_RELEASE_CAPSULE | Freq: Two times a day (BID) | ORAL | 3 refills | Status: DC

## 2022-08-24 NOTE — Assessment & Plan Note (Signed)
Well controlled, no changes to meds. Encouraged heart healthy diet such as the DASH diet and exercise as tolerated.  °

## 2022-08-24 NOTE — Assessment & Plan Note (Signed)
Tolerating statin, encouraged heart healthy diet, avoid trans fats, minimize simple carbs and saturated fats. Increase exercise as tolerated 

## 2022-08-24 NOTE — Patient Instructions (Signed)
Preventive Care 40-62 Years Old, Female Preventive care refers to lifestyle choices and visits with your health care provider that can promote health and wellness. Preventive care visits are also called wellness exams. What can I expect for my preventive care visit? Counseling Your health care provider may ask you questions about your: Medical history, including: Past medical problems. Family medical history. Pregnancy history. Current health, including: Menstrual cycle. Method of birth control. Emotional well-being. Home life and relationship well-being. Sexual activity and sexual health. Lifestyle, including: Alcohol, nicotine or tobacco, and drug use. Access to firearms. Diet, exercise, and sleep habits. Work and work environment. Sunscreen use. Safety issues such as seatbelt and bike helmet use. Physical exam Your health care provider will check your: Height and weight. These may be used to calculate your BMI (body mass index). BMI is a measurement that tells if you are at a healthy weight. Waist circumference. This measures the distance around your waistline. This measurement also tells if you are at a healthy weight and may help predict your risk of certain diseases, such as type 2 diabetes and high blood pressure. Heart rate and blood pressure. Body temperature. Skin for abnormal spots. What immunizations do I need?  Vaccines are usually given at various ages, according to a schedule. Your health care provider will recommend vaccines for you based on your age, medical history, and lifestyle or other factors, such as travel or where you work. What tests do I need? Screening Your health care provider may recommend screening tests for certain conditions. This may include: Lipid and cholesterol levels. Diabetes screening. This is done by checking your blood sugar (glucose) after you have not eaten for a while (fasting). Pelvic exam and Pap test. Hepatitis B test. Hepatitis C  test. HIV (human immunodeficiency virus) test. STI (sexually transmitted infection) testing, if you are at risk. Lung cancer screening. Colorectal cancer screening. Mammogram. Talk with your health care provider about when you should start having regular mammograms. This may depend on whether you have a family history of breast cancer. BRCA-related cancer screening. This may be done if you have a family history of breast, ovarian, tubal, or peritoneal cancers. Bone density scan. This is done to screen for osteoporosis. Talk with your health care provider about your test results, treatment options, and if necessary, the need for more tests. Follow these instructions at home: Eating and drinking  Eat a diet that includes fresh fruits and vegetables, whole grains, lean protein, and low-fat dairy products. Take vitamin and mineral supplements as recommended by your health care provider. Do not drink alcohol if: Your health care provider tells you not to drink. You are pregnant, may be pregnant, or are planning to become pregnant. If you drink alcohol: Limit how much you have to 0-1 drink a day. Know how much alcohol is in your drink. In the [place] one drink equals one 12 oz bottle of beer (355 mL), one 5 oz glass of wine (148 mL), or one 1 oz glass of hard liquor (44 mL). Lifestyle Brush your teeth every morning and night with fluoride toothpaste. Floss one time each day. Exercise for at least 30 minutes 5 or more days each week. Do not use any products that contain nicotine or tobacco. These products include cigarettes, chewing tobacco, and vaping devices, such as e-cigarettes. If you need help quitting, ask your health care provider. Do not use drugs. If you are sexually active, practice safe sex. Use a condom or other form of protection to   prevent STIs. If you do not wish to become pregnant, use a form of birth control. If you plan to become pregnant, see your health care provider for a  prepregnancy visit. Take aspirin only as told by your health care provider. Make sure that you understand how much to take and what form to take. Work with your health care provider to find out whether it is safe and beneficial for you to take aspirin daily. Find healthy ways to manage stress, such as: Meditation, yoga, or listening to music. Journaling. Talking to a trusted person. Spending time with friends and family. Minimize exposure to UV radiation to reduce your risk of skin cancer. Safety Always wear your seat belt while driving or riding in a vehicle. Do not drive: If you have been drinking alcohol. Do not ride with someone who has been drinking. When you are tired or distracted. While texting. If you have been using any mind-altering substances or drugs. Wear a helmet and other protective equipment during sports activities. If you have firearms in your house, make sure you follow all gun safety procedures. Seek help if you have been physically or sexually abused. What's next? Visit your health care provider once a year for an annual wellness visit. Ask your health care provider how often you should have your eyes and teeth checked. Stay up to date on all vaccines. This information is not intended to replace advice given to you by your health care provider. Make sure you discuss any questions you have with your health care provider. Document Revised: 07/30/2020 Document Reviewed: 07/30/2020 Elsevier Patient Education  2024 [employer]  

## 2022-08-24 NOTE — Assessment & Plan Note (Signed)
[employer] prilosec to bid Refer to GI

## 2022-08-24 NOTE — Telephone Encounter (Signed)
PA initiated via Covermymeds; KEY: [id number]. PA approved.   [id number];HQIONG:[id number];Review Type:Prior Auth;Coverage Start Date:07/25/2022;Coverage End Date:04/21/2023;

## 2022-08-24 NOTE — Assessment & Plan Note (Signed)
[facility]hm utd Check labs  See [facility] Health Maintenance  Topic Date Due   Hepatitis C Screening  Never done   DTaP/Tdap/Td (1 - Tdap) Never done   Zoster Vaccines- Shingrix (1 of 2) Never done   PAP SMEAR-Modifier  02/11/2017   COVID-19 Vaccine (4 - 2023-24 season) 10/16/2021   MAMMOGRAM  08/21/2022   HIV Screening  08/24/2023 (Originally 02/19/1975)   INFLUENZA VACCINE  09/16/2022   Colonoscopy  09/28/2025   HPV VACCINES  Aged Out

## 2022-08-24 NOTE — Progress Notes (Signed)
Established Patient Office Visit  Subjective   Patient ID: Diana Randall, female    DOB: October 23, 1960  Age: 62 y.o. MRN: [id number]  Chief Complaint  Patient presents with   Annual Exam    HPI Discussed the use of AI scribe software for clinical note transcription with the patient, who gave verbal consent to proceed.  History of Present Illness   The patient, with a history of reflux, knee pain, and weight gain, presents for a routine physical. She reports ongoing reflux symptoms despite daily Prilosec, describing nighttime discomfort, cough, and sore throat. She has an upcoming appointment with her gastroenterologist, Dr. Veleta Miners, and is considering increasing her Prilosec dose to twice daily.  The patient also reports persistent left knee pain since December, which she attributes to a Baker's cyst and arthritis. The pain is intermittent, with periods of relief followed by flare-ups. She has been managing the pain with exercises provided by her doctor and is considering an MRI if the pain does not improve.  Lastly, the patient expresses frustration with her inability to lose weight despite dietary changes. She reports that many of her coworkers have had success with the weight loss medication Saxenda, and she is interested in trying it.  The patient also mentions an upcoming mammogram and bone density scan, as well as a Pap smear in November. She has not had a bone density scan before and is considering scheduling one with her gynecologist, Dr. Marton Redwood. She also mentions a history of a C-section and an eye surgery in high school.      Patient Active Problem List   Diagnosis Date Noted   Preventative health care 08/24/2022   Pneumonia of left upper lobe due to infectious organism 11/04/2020   Bronchospasm 11/04/2020   Influenza 11/04/2020   Morbid obesity (HCC) 09/19/2020   Hypokalemia 04/27/2019   History of COVID-19 04/27/2019   Hair loss 04/27/2019   Gastroesophageal reflux disease  04/27/2019   Essential tremor 04/27/2019   Cough 10/29/2016   Upper respiratory tract infection 10/29/2016   Dizzy 10/29/2016   HTN (hypertension) 11/06/2014   Acute bronchitis 03/09/2013   Obesity (BMI 30-39.9) 11/23/2012   Radicular leg pain 10/15/2010   Hyperlipidemia 01/07/2009   CHEST PAIN UNSPECIFIED 12/23/2008   SHOULDER PAIN, RIGHT 09/16/2008   MURMUR 01/01/2008   Essential hypertension 12/18/2007   Past Medical History:  Diagnosis Date   GERD (gastroesophageal reflux disease)    Hyperlipidemia    Hypertension    Past Surgical History:  Procedure Laterality Date   CESAREAN SECTION  1995   Social History   Tobacco Use   Smoking status: Never   Smokeless tobacco: Never  Substance Use Topics   Alcohol use: Yes   Drug use: No   Social History   Socioeconomic History   Marital status: Married    Spouse name: Not on file   Number of children: Not on file   Years of education: Not on file   Highest education level: Not on file  Occupational History   Occupation: [employer]    Employer: [employer]  Tobacco Use   Smoking status: Never   Smokeless tobacco: Never  Substance and Sexual Activity   Alcohol use: Yes   Drug use: No   Sexual activity: Not Currently    Partners: Male  Other Topics Concern   Not on file  Social History Narrative   Exercise-- no   Social Determinants of Health   Financial Resource Strain: Not on file  Food  Insecurity: Not on file  Transportation Needs: Not on file  Physical Activity: Not on file  Stress: Not on file  Social Connections: Not on file  Intimate Partner Violence: Not on file   Family Status  Relation Name Status   Father  Deceased       covid   Brother  Deceased   Family History  Problem Relation Age of Onset   Stomach cancer Brother    Allergies  Allergen Reactions   Latex Rash      Review of Systems  Constitutional:  Negative for fever and malaise/fatigue.  HENT:  Negative for congestion.   Eyes:   Negative for blurred vision.  Respiratory:  Negative for shortness of breath.   Cardiovascular:  Negative for chest pain, palpitations and leg swelling.  Gastrointestinal:  Negative for abdominal pain, blood in stool and nausea.  Genitourinary:  Negative for dysuria and frequency.  Musculoskeletal:  Negative for falls.  Skin:  Negative for rash.  Neurological:  Negative for dizziness, loss of consciousness and headaches.  Endo/Heme/Allergies:  Negative for environmental allergies.  Psychiatric/Behavioral:  Negative for depression. The patient is not nervous/anxious.       Objective:     BP 134/86 (BP Location: Left Arm, Patient Position: Sitting, Cuff Size: Normal)   Pulse 86   Temp 98.4 F (36.9 C) (Oral)   Resp 20   Ht 5\' 5"  (1.651 m)   Wt 210 lb (95.3 kg)   SpO2 99%   BMI 34.95 kg/m  BP Readings from Last 3 Encounters:  08/24/22 134/86  04/07/22 (!) 150/80  12/03/21 139/83   Wt Readings from Last 3 Encounters:  08/24/22 210 lb (95.3 kg)  04/07/22 208 lb 9.6 oz (94.6 kg)  12/03/21 209 lb 12.8 oz (95.2 kg)   SpO2 Readings from Last 3 Encounters:  08/24/22 99%  04/07/22 98%  12/03/21 97%      Physical Exam Vitals and nursing note reviewed.  Constitutional:      General: She is not in acute distress.    Appearance: Normal appearance. She is well-developed.  HENT:     Head: Normocephalic and atraumatic.     Right Ear: Tympanic membrane, ear canal and external ear normal. There is no impacted cerumen.     Left Ear: Tympanic membrane, ear canal and external ear normal. There is no impacted cerumen.     Nose: Nose normal.     Mouth/Throat:     Mouth: Mucous membranes are moist.     Pharynx: Oropharynx is clear. No oropharyngeal exudate or posterior oropharyngeal erythema.  Eyes:     General: No scleral icterus.       Right eye: No discharge.        Left eye: No discharge.     Conjunctiva/sclera: Conjunctivae normal.     Pupils: Pupils are equal, round, and  reactive to light.  Neck:     Thyroid: No thyromegaly or thyroid tenderness.     Vascular: No JVD.  Cardiovascular:     Rate and Rhythm: Normal rate and regular rhythm.     Heart sounds: Normal heart sounds. No murmur heard. Pulmonary:     Effort: Pulmonary effort is normal. No respiratory distress.     Breath sounds: Normal breath sounds.  Abdominal:     General: Bowel sounds are normal. There is no distension.     Palpations: Abdomen is soft. There is no mass.     Tenderness: There is no abdominal tenderness. There is  no guarding or rebound.  Genitourinary:    Vagina: Normal.  Musculoskeletal:        General: Normal range of motion.     Cervical back: Normal range of motion and neck supple.     Right lower leg: No edema.     Left lower leg: No edema.  Lymphadenopathy:     Cervical: No cervical adenopathy.  Skin:    General: Skin is warm and dry.     Findings: No erythema or rash.  Neurological:     Mental Status: She is alert and oriented to person, place, and time.     Cranial Nerves: No cranial nerve deficit.     Deep Tendon Reflexes: Reflexes are normal and symmetric.  Psychiatric:        Mood and Affect: Mood normal.        Behavior: Behavior normal.        Thought Content: Thought content normal.        Judgment: Judgment normal.      No results found for any visits on 08/24/22.  Last CBC Lab Results  Component Value Date   WBC 4.2 07/16/2021   HGB 13.9 07/16/2021   HCT 41.4 07/16/2021   MCV 87.5 07/16/2021   RDW 13.9 07/16/2021   PLT 269.0 07/16/2021   Last metabolic panel Lab Results  Component Value Date   GLUCOSE 89 07/16/2021   NA 141 07/16/2021   K 3.7 07/16/2021   CL 102 07/16/2021   CO2 31 07/16/2021   BUN 12 07/16/2021   CREATININE 0.90 07/16/2021   GFR 69.05 07/16/2021   CALCIUM 9.2 07/16/2021   PROT 6.2 07/16/2021   ALBUMIN 3.8 07/16/2021   BILITOT 0.5 07/16/2021   ALKPHOS 66 07/16/2021   AST 23 07/16/2021   ALT 21 07/16/2021    Last lipids Lab Results  Component Value Date   CHOL 248 (H) 02/17/2021   HDL 53.60 02/17/2021   LDLCALC 188 (H) 09/19/2020   LDLDIRECT 177.0 02/17/2021   TRIG 209.0 (H) 02/17/2021   CHOLHDL 5 02/17/2021   Last hemoglobin A1c No results found for: "HGBA1C" Last thyroid functions Lab Results  Component Value Date   TSH 1.61 04/26/2019   T4TOTAL 6.4 04/26/2019   Last vitamin D No results found for: "25OHVITD2", "25OHVITD3", "VD25OH" Last vitamin B12 and Folate Lab Results  Component Value Date   VITAMINB12 220 04/26/2019      The ASCVD Risk score (Arnett DK, et al., 2019) failed to calculate for the following reasons:   Unable to determine if patient is Non-Hispanic African American    Assessment & Plan:   Problem List Items Addressed This Visit       Unprioritized   Morbid obesity (HCC)   Relevant Medications   tirzepatide (ZEPBOUND) 2.5 MG/0.5ML Pen   Preventative health care - Primary    Ghm utd Check labs  See AVS Health Maintenance  Topic Date Due   Hepatitis C Screening  Never done   DTaP/Tdap/Td (1 - Tdap) Never done   Zoster Vaccines- Shingrix (1 of 2) Never done   PAP SMEAR-Modifier  02/11/2017   COVID-19 Vaccine (4 - 2023-24 season) 10/16/2021   MAMMOGRAM  08/21/2022   HIV Screening  08/24/2023 (Originally 02/19/1975)   INFLUENZA VACCINE  09/16/2022   Colonoscopy  09/28/2025   HPV VACCINES  Aged Out        Relevant Orders   CBC with Differential/Platelet   Comprehensive metabolic panel   Lipid panel  TSH   Hyperlipidemia    Tolerating statin, encouraged heart healthy diet, avoid trans fats, minimize simple carbs and saturated fats. Increase exercise as tolerated       Relevant Medications   olmesartan-hydrochlorothiazide (BENICAR HCT) 40-25 MG tablet   Gastroesophageal reflux disease    Worsening Inc prilosec to bid Refer to GI      Relevant Medications   omeprazole (PRILOSEC) 40 MG capsule   pantoprazole (PROTONIX) 40 MG tablet    Other Relevant Orders   H. pylori antibody, IgG   Ambulatory referral to Gastroenterology   Essential hypertension    Well controlled, no changes to meds. Encouraged heart healthy diet such as the DASH diet and exercise as tolerated.        Relevant Medications   olmesartan-hydrochlorothiazide (BENICAR HCT) 40-25 MG tablet   Other Visit Diagnoses     Need for hepatitis C screening test       Relevant Orders   Hepatitis C antibody     Assessment and Plan            Return in about 3 months (around 11/24/2022), or if symptoms worsen or fail to improve.    [name], DO

## 2022-08-25 LAB — HEPATITIS C ANTIBODY: Hepatitis C Ab: NONREACTIVE

## 2022-08-25 LAB — H. PYLORI ANTIBODY, IGG: H Pylori IgG: NEGATIVE

## 2022-08-26 DIAGNOSIS — Z1231 Encounter for screening mammogram for malignant neoplasm of breast: Secondary | ICD-10-CM | POA: Diagnosis not present

## 2022-08-26 LAB — HM MAMMOGRAPHY

## 2022-08-30 ENCOUNTER — Encounter: Payer: Self-pay | Admitting: Family Medicine

## 2022-09-20 ENCOUNTER — Other Ambulatory Visit: Payer: Self-pay | Admitting: Family Medicine

## 2022-09-20 MED ORDER — ZEPBOUND 5 MG/0.5ML ~~LOC~~ SOAJ
5.0000 mg | SUBCUTANEOUS | 0 refills | Status: DC
Start: 2022-09-20 — End: 2022-10-19

## 2022-09-28 ENCOUNTER — Other Ambulatory Visit: Payer: Self-pay | Admitting: Family Medicine

## 2022-09-28 DIAGNOSIS — I10 Essential (primary) hypertension: Secondary | ICD-10-CM

## 2022-09-28 MED ORDER — AMLODIPINE BESYLATE 10 MG PO TABS
10.0000 mg | ORAL_TABLET | Freq: Every day | ORAL | 1 refills | Status: DC
Start: 2022-09-28 — End: 2022-11-30

## 2022-09-29 DIAGNOSIS — K225 Diverticulum of esophagus, acquired: Secondary | ICD-10-CM | POA: Diagnosis not present

## 2022-09-29 DIAGNOSIS — K219 Gastro-esophageal reflux disease without esophagitis: Secondary | ICD-10-CM | POA: Diagnosis not present

## 2022-09-29 DIAGNOSIS — K449 Diaphragmatic hernia without obstruction or gangrene: Secondary | ICD-10-CM | POA: Diagnosis not present

## 2022-09-29 DIAGNOSIS — K2100 Gastro-esophageal reflux disease with esophagitis, without bleeding: Secondary | ICD-10-CM | POA: Diagnosis not present

## 2022-10-19 ENCOUNTER — Other Ambulatory Visit: Payer: Self-pay | Admitting: Family Medicine

## 2022-10-19 MED ORDER — ZEPBOUND 5 MG/0.5ML ~~LOC~~ SOAJ
5.0000 mg | SUBCUTANEOUS | 0 refills | Status: AC
Start: 2022-10-19 — End: ?

## 2022-11-12 ENCOUNTER — Other Ambulatory Visit: Payer: Self-pay | Admitting: Family Medicine

## 2022-11-12 DIAGNOSIS — K449 Diaphragmatic hernia without obstruction or gangrene: Secondary | ICD-10-CM | POA: Diagnosis not present

## 2022-11-12 DIAGNOSIS — K2100 Gastro-esophageal reflux disease with esophagitis, without bleeding: Secondary | ICD-10-CM | POA: Diagnosis not present

## 2022-11-12 DIAGNOSIS — K225 Diverticulum of esophagus, acquired: Secondary | ICD-10-CM | POA: Diagnosis not present

## 2022-11-12 DIAGNOSIS — Z800 Family history of malignant neoplasm of digestive organs: Secondary | ICD-10-CM | POA: Diagnosis not present

## 2022-11-12 DIAGNOSIS — K3189 Other diseases of stomach and duodenum: Secondary | ICD-10-CM | POA: Diagnosis not present

## 2022-11-19 ENCOUNTER — Telehealth: Payer: Self-pay | Admitting: Family Medicine

## 2022-11-19 MED ORDER — ZEPBOUND 5 MG/0.5ML ~~LOC~~ SOAJ
5.0000 mg | SUBCUTANEOUS | 0 refills | Status: DC
Start: 2022-11-19 — End: 2023-02-10

## 2022-11-19 NOTE — Telephone Encounter (Signed)
Patient called to advise that her zepbound prescription has to be sent in as a 90 day supply for insurance to pay. Patient would like a call to advise when updated prescription has been sent in.

## 2022-11-19 NOTE — Telephone Encounter (Signed)
Rx sent 

## 2022-11-19 NOTE — Addendum Note (Signed)
Addended by: Roxanne Gates on: 11/19/2022 10:36 AM   Modules accepted: Orders

## 2022-11-30 ENCOUNTER — Ambulatory Visit (INDEPENDENT_AMBULATORY_CARE_PROVIDER_SITE_OTHER): Payer: BC Managed Care – PPO | Admitting: Family Medicine

## 2022-11-30 ENCOUNTER — Encounter: Payer: Self-pay | Admitting: Family Medicine

## 2022-11-30 DIAGNOSIS — I10 Essential (primary) hypertension: Secondary | ICD-10-CM | POA: Diagnosis not present

## 2022-11-30 DIAGNOSIS — K219 Gastro-esophageal reflux disease without esophagitis: Secondary | ICD-10-CM | POA: Diagnosis not present

## 2022-11-30 LAB — COMPREHENSIVE METABOLIC PANEL WITH GFR
ALT: 14 U/L (ref 0–35)
AST: 16 U/L (ref 0–37)
Albumin: 4.1 g/dL (ref 3.5–5.2)
Alkaline Phosphatase: 78 U/L (ref 39–117)
BUN: 15 mg/dL (ref 6–23)
CO2: 31 meq/L (ref 19–32)
Calcium: 9.8 mg/dL (ref 8.4–10.5)
Chloride: 102 meq/L (ref 96–112)
Creatinine, Ser: 0.93 mg/dL (ref 0.40–1.20)
GFR: 65.75 mL/min
Glucose, Bld: 87 mg/dL (ref 70–99)
Potassium: 3.6 meq/L (ref 3.5–5.1)
Sodium: 141 meq/L (ref 135–145)
Total Bilirubin: 0.8 mg/dL (ref 0.2–1.2)
Total Protein: 6.8 g/dL (ref 6.0–8.3)

## 2022-11-30 LAB — LIPID PANEL
Cholesterol: 216 mg/dL — ABNORMAL HIGH (ref 0–200)
HDL: 43.8 mg/dL (ref >39.00)
LDL Cholesterol: 154 mg/dL — ABNORMAL HIGH (ref 0–99)
NonHDL: 172.23
Total CHOL/HDL Ratio: 5
Triglycerides: 91 mg/dL (ref 0.0–149.0)
VLDL: 18.2 mg/dL (ref 0.0–40.0)

## 2022-11-30 MED ORDER — AMLODIPINE BESYLATE 10 MG PO TABS: 10.0000 mg | ORAL_TABLET | Freq: Every day | ORAL | 1 refills | Status: DC

## 2022-11-30 MED ORDER — OLMESARTAN MEDOXOMIL-HCTZ 40-25 MG PO TABS
1.0000 | ORAL_TABLET | Freq: Every day | ORAL | 1 refills | Status: DC
Start: 2022-11-30 — End: 2023-06-09

## 2022-11-30 NOTE — Progress Notes (Signed)
Established Patient Office Visit  Subjective   Patient ID: Diana Randall, female    DOB: 1960/05/08  Age: 62 y.o. MRN: [id number]  Chief Complaint  Patient presents with   Weight Check   Follow-up    HPI Discussed the use of AI scribe software for clinical note transcription with the patient, who gave verbal consent to proceed.  History of Present Illness   The patient, with a history of reflux and diverticulitis, presents for a follow-up visit after starting Zepbound for weight loss. She reports a successful weight loss of 17 pounds since her last physical. She has noticed a decrease in her waistline and breast size, requiring new clothing. She has been focusing on a high protein diet, including chicken, protein bars, and fruits. She also mentions a mole on her back that has been growing and itching. She attempted to remove it herself, leading to some discomfort.  In addition, the patient recently had an endoscopy for her reflux. The procedure was unsuccessful due to food remaining in the stomach despite fasting. The patient also mentions having diverticulitis, which affects her diet, particularly her intake of nuts.      Patient Active Problem List   Diagnosis Date Noted   Preventative health care 08/24/2022   Pneumonia of left upper lobe due to infectious organism 11/04/2020   Bronchospasm 11/04/2020   Influenza 11/04/2020   Morbid obesity (HCC) 09/19/2020   Hypokalemia 04/27/2019   History of COVID-19 04/27/2019   Hair loss 04/27/2019   Gastroesophageal reflux disease 04/27/2019   Essential tremor 04/27/2019   Cough 10/29/2016   Upper respiratory tract infection 10/29/2016   Dizzy 10/29/2016   HTN (hypertension) 11/06/2014   Acute bronchitis 03/09/2013   Obesity (BMI 30-39.9) 11/23/2012   Radicular leg pain 10/15/2010   Hyperlipidemia 01/07/2009   CHEST PAIN UNSPECIFIED 12/23/2008   SHOULDER PAIN, RIGHT 09/16/2008   MURMUR 01/01/2008   Essential hypertension  12/18/2007   Past Medical History:  Diagnosis Date   GERD (gastroesophageal reflux disease)    Hyperlipidemia    Hypertension    Past Surgical History:  Procedure Laterality Date   CESAREAN SECTION  1995   Social History   Tobacco Use   Smoking status: Never   Smokeless tobacco: Never  Substance Use Topics   Alcohol use: Yes   Drug use: No   Social History   Socioeconomic History   Marital status: Married    Spouse name: Not on file   Number of children: Not on file   Years of education: Not on file   Highest education level: Bachelor's degree (e.g., BA, AB, BS)  Occupational History   Occupation: [employer]    Employer: [employer]  Tobacco Use   Smoking status: Never   Smokeless tobacco: Never  Substance and Sexual Activity   Alcohol use: Yes   Drug use: No   Sexual activity: Not Currently    Partners: Male  Other Topics Concern   Not on file  Social History Narrative   Exercise-- no   Social Determinants of Health   Financial Resource Strain: Low Risk  (11/29/2022)   Overall Financial Resource Strain (CARDIA)    Difficulty of Paying Living Expenses: Not hard at all  Food Insecurity: No Food Insecurity (11/29/2022)   Hunger Vital Sign    Worried About Running Out of Food in the Last Year: Never true    Ran Out of Food in the Last Year: Never true  Transportation Needs: No Transportation Needs (11/29/2022)  PRAPARE - Administrator, Civil Service (Medical): No    Lack of Transportation (Non-Medical): No  Physical Activity: Insufficiently Active (11/29/2022)   Exercise Vital Sign    Days of Exercise per Week: 3 days    Minutes of Exercise per Session: 30 min  Stress: No Stress Concern Present (11/29/2022)   [employer] of Occupational Health - Occupational Stress Questionnaire    Feeling of Stress : Only a little  Social Connections: Socially Integrated (11/29/2022)   Social Connection and Isolation Panel [NHANES]    Frequency of Communication  with Friends and Family: More than three times a week    Frequency of Social Gatherings with Friends and Family: Once a week    Attends Religious Services: More than 4 times per year    Active Member of [employer] or Organizations: Yes    Attends Banker Meetings: 1 to 4 times per year    Marital Status: Married  Catering manager Violence: Not on file   Family Status  Relation Name Status   Father  Deceased       covid   Brother  Deceased  No partnership data on file   Family History  Problem Relation Age of Onset   Stomach cancer Brother    Allergies  Allergen Reactions   Latex Rash      Review of Systems  Constitutional:  Negative for chills, fever and malaise/fatigue.  HENT:  Negative for congestion and hearing loss.   Eyes:  Negative for blurred vision and discharge.  Respiratory:  Negative for cough, sputum production and shortness of breath.   Cardiovascular:  Negative for chest pain, palpitations and leg swelling.  Gastrointestinal:  Negative for abdominal pain, blood in stool, constipation, diarrhea, heartburn, nausea and vomiting.  Genitourinary:  Negative for dysuria, frequency, hematuria and urgency.  Musculoskeletal:  Negative for back pain, falls and myalgias.  Skin:  Negative for rash.  Neurological:  Negative for dizziness, sensory change, loss of consciousness, weakness and headaches.  Endo/Heme/Allergies:  Negative for environmental allergies. Does not bruise/bleed easily.  Psychiatric/Behavioral:  Negative for depression and suicidal ideas. The patient is not nervous/anxious and does not have insomnia.       Objective:     BP 120/86 (BP Location: Right Arm, Patient Position: Sitting, Cuff Size: Large)   Pulse 88   Temp 98.1 F (36.7 C) (Oral)   Resp 18   Ht 5\' 5"  (1.651 m)   Wt 193 lb 3.2 oz (87.6 kg)   SpO2 98%   BMI 32.15 kg/m  BP Readings from Last 3 Encounters:  11/30/22 120/86  08/24/22 134/86  04/07/22 (!) 150/80   Wt Readings  from Last 3 Encounters:  11/30/22 193 lb 3.2 oz (87.6 kg)  08/24/22 210 lb (95.3 kg)  04/07/22 208 lb 9.6 oz (94.6 kg)   SpO2 Readings from Last 3 Encounters:  11/30/22 98%  08/24/22 99%  04/07/22 98%      Physical Exam Vitals and nursing note reviewed.  Constitutional:      General: She is not in acute distress.    Appearance: Normal appearance. She is well-developed.  HENT:     Head: Normocephalic and atraumatic.     Right Ear: Tympanic membrane, ear canal and external ear normal. There is no impacted cerumen.     Left Ear: Tympanic membrane, ear canal and external ear normal. There is no impacted cerumen.     Nose: Nose normal.     Mouth/Throat:  Mouth: Mucous membranes are moist.     Pharynx: Oropharynx is clear. No oropharyngeal exudate or posterior oropharyngeal erythema.  Eyes:     General: No scleral icterus.       Right eye: No discharge.        Left eye: No discharge.     Conjunctiva/sclera: Conjunctivae normal.     Pupils: Pupils are equal, round, and reactive to light.  Neck:     Thyroid: No thyromegaly or thyroid tenderness.     Vascular: No JVD.  Cardiovascular:     Rate and Rhythm: Normal rate and regular rhythm.     Heart sounds: Normal heart sounds. No murmur heard. Pulmonary:     Effort: Pulmonary effort is normal. No respiratory distress.     Breath sounds: Normal breath sounds.  Abdominal:     General: Bowel sounds are normal. There is no distension.     Palpations: Abdomen is soft. There is no mass.     Tenderness: There is no abdominal tenderness. There is no guarding or rebound.  Genitourinary:    Vagina: Normal.  Musculoskeletal:        General: Normal range of motion.     Cervical back: Normal range of motion and neck supple.     Right lower leg: No edema.     Left lower leg: No edema.  Lymphadenopathy:     Cervical: No cervical adenopathy.  Skin:    General: Skin is warm and dry.     Findings: No erythema or rash.           Comments: Black head middle of back   Neurological:     Mental Status: She is alert and oriented to person, place, and time.     Cranial Nerves: No cranial nerve deficit.     Deep Tendon Reflexes: Reflexes are normal and symmetric.  Psychiatric:        Mood and Affect: Mood normal.        Behavior: Behavior normal.        Thought Content: Thought content normal.        Judgment: Judgment normal.      No results found for any visits on 11/30/22.  Last CBC Lab Results  Component Value Date   WBC 6.7 08/24/2022   HGB 13.7 08/24/2022   HCT 40.7 08/24/2022   MCV 88.3 08/24/2022   RDW 13.5 08/24/2022   PLT 292.0 08/24/2022   Last metabolic panel Lab Results  Component Value Date   GLUCOSE 111 (H) 08/24/2022   NA 138 08/24/2022   K 3.5 08/24/2022   CL 99 08/24/2022   CO2 32 08/24/2022   BUN 14 08/24/2022   CREATININE 0.83 08/24/2022   GFR 75.51 08/24/2022   CALCIUM 9.7 08/24/2022   PROT 6.7 08/24/2022   ALBUMIN 4.0 08/24/2022   BILITOT 0.9 08/24/2022   ALKPHOS 87 08/24/2022   AST 15 08/24/2022   ALT 13 08/24/2022   Last lipids Lab Results  Component Value Date   CHOL 231 (H) 08/24/2022   HDL 50.20 08/24/2022   LDLCALC 159 (H) 08/24/2022   LDLDIRECT 177.0 02/17/2021   TRIG 107.0 08/24/2022   CHOLHDL 5 08/24/2022   Last hemoglobin A1c No results found for: "HGBA1C" Last thyroid functions Lab Results  Component Value Date   TSH 2.20 08/24/2022   T4TOTAL 6.4 04/26/2019   Last vitamin D No results found for: "25OHVITD2", "25OHVITD3", "VD25OH" Last vitamin B12 and Folate Lab Results  Component Value Date  MVHQIONG29 220 04/26/2019      The ASCVD Risk score (Arnett DK, et al., 2019) failed to calculate for the following reasons:   Unable to determine if patient is Non-Hispanic African American    Assessment & Plan:   Problem List Items Addressed This Visit       Unprioritized   Morbid obesity (HCC) - Primary   Relevant Orders   Comprehensive  metabolic panel   Lipid panel   Gastroesophageal reflux disease   Essential hypertension   Relevant Medications   amLODipine (NORVASC) 10 MG tablet   olmesartan-hydrochlorothiazide (BENICAR HCT) 40-25 MG tablet   Other Relevant Orders   Comprehensive metabolic panel   Lipid panel  Assessment and Plan    Weight Management Patient is on Zepbound 5mg  and has lost 17 pounds since last visit. She reports difficulty with protein intake and has been using protein supplements. She is satisfied with her current dose and has a 74-month supply. -Continue Zepbound 5mg  daily. -Encourage high protein diet and use of protein supplements. -Consider dose increase in the future if patient desires more rapid weight loss.  Gastroesophageal Reflux Disease (GERD) Patient underwent endoscopy for reflux symptoms. Procedure was incomplete due to retained food in the stomach. Hiatal hernia was noted. -Follow-up with GI specialist for further management.  Skin Lesion Patient presented with a concern about a growing lesion on her back. On examination, it was determined to be a large blackhead. -Extracted blackhead in office. -Advised patient to monitor the area and return if there are any changes.  Hyperlipidemia Patient admits to recent dietary indiscretions. Plan to check lipid panel today. -Check lipid panel today. -Encourage patient to maintain a heart-healthy diet.  Diverticulitis Patient reports history of diverticulitis and is mindful of her diet. -Continue dietary management.        No follow-ups on file.    [name], DO

## 2022-11-30 NOTE — Patient Instructions (Signed)
Calorie Counting for Weight Loss Calories are units of energy. Your body needs a certain number of calories from food to keep going throughout the day. When you eat or drink more calories than your body needs, your body stores the extra calories mostly as fat. When you eat or drink fewer calories than your body needs, your body burns fat to get the energy it needs. Calorie counting means keeping track of how many calories you eat and drink each day. Calorie counting can be helpful if you need to lose weight. If you eat fewer calories than your body needs, you should lose weight. Ask your health care provider what a healthy weight is for you. For calorie counting to work, you will need to eat the right number of calories each day to lose a healthy amount of weight per week. A dietitian can help you figure out how many calories you need in a day and will suggest ways to reach your calorie goal. A healthy amount of weight to lose each week is usually 1-2 lb (0.5-0.9 kg). This usually means that your daily calorie intake should be reduced by 500-750 calories. Eating 1,200-1,500 calories a day can help most women lose weight. Eating 1,500-1,800 calories a day can help most men lose weight. What do I need to know about calorie counting? Work with your health care provider or dietitian to determine how many calories you should get each day. To meet your daily calorie goal, you will need to: Find out how many calories are in each food that you would like to eat. Try to do this before you eat. Decide how much of the food you plan to eat. Keep a food log. Do this by writing down what you ate and how many calories it had. To successfully lose weight, it is important to balance calorie counting with a healthy lifestyle that includes regular activity. Where do I find calorie information?  The number of calories in a food can be found on a Nutrition Facts label. If a food does not have a Nutrition Facts label, try  to look up the calories online or ask your dietitian for help. Remember that calories are listed per serving. If you choose to have more than one serving of a food, you will have to multiply the calories per serving by the number of servings you plan to eat. For example, the label on a package of bread might say that a serving size is 1 slice and that there are 90 calories in a serving. If you eat 1 slice, you will have eaten 90 calories. If you eat 2 slices, you will have eaten 180 calories. How do I keep a food log? After each time that you eat, record the following in your food log as soon as possible: What you ate. Be sure to include toppings, sauces, and other extras on the food. How much you ate. This can be measured in cups, ounces, or number of items. How many calories were in each food and drink. The total number of calories in the food you ate. Keep your food log near you, such as in a pocket-sized notebook or on an app or website on your mobile phone. Some programs will calculate calories for you and show you how many calories you have left to meet your daily goal. What are some portion-control tips? Know how many calories are in a serving. This will help you know how many servings you can have of a certain  food. Use a measuring cup to measure serving sizes. You could also try weighing out portions on a kitchen scale. With time, you will be able to estimate serving sizes for some foods. Take time to put servings of different foods on your favorite plates or in your favorite bowls and cups so you know what a serving looks like. Try not to eat straight from a food's packaging, such as from a bag or box. Eating straight from the package makes it hard to see how much you are eating and can lead to overeating. Put the amount you would like to eat in a cup or on a plate to make sure you are eating the right portion. Use smaller plates, glasses, and bowls for smaller portions and to prevent  overeating. Try not to multitask. For example, avoid watching TV or using your computer while eating. If it is time to eat, sit down at a table and enjoy your food. This will help you recognize when you are full. It will also help you be more mindful of what and how much you are eating. What are tips for following this plan? Reading food labels Check the calorie count compared with the serving size. The serving size may be smaller than what you are used to eating. Check the source of the calories. Try to choose foods that are high in protein, fiber, and vitamins, and low in saturated fat, trans fat, and sodium. Shopping Read nutrition labels while you shop. This will help you make healthy decisions about which foods to buy. Pay attention to nutrition labels for low-fat or fat-free foods. These foods sometimes have the same number of calories or more calories than the full-fat versions. They also often have added sugar, starch, or salt to make up for flavor that was removed with the fat. Make a grocery list of lower-calorie foods and stick to it. Cooking Try to cook your favorite foods in a healthier way. For example, try baking instead of frying. Use low-fat dairy products. Meal planning Use more fruits and vegetables. One-half of your plate should be fruits and vegetables. Include lean proteins, such as chicken, [place], and fish. Lifestyle Each week, aim to do one of the following: 150 minutes of moderate exercise, such as walking. 75 minutes of vigorous exercise, such as running. General information Know how many calories are in the foods you eat most often. This will help you calculate calorie counts faster. Find a way of tracking calories that works for you. Get creative. Try different apps or programs if writing down calories does not work for you. What foods should I eat?  Eat nutritious foods. It is better to have a nutritious, high-calorie food, such as an avocado, than a food with  few nutrients, such as a bag of potato chips. Use your calories on foods and drinks that will fill you up and will not leave you hungry soon after eating. Examples of foods that fill you up are nuts and nut butters, vegetables, lean proteins, and high-fiber foods such as whole grains. High-fiber foods are foods with more than 5 g of fiber per serving. Pay attention to calories in drinks. Low-calorie drinks include water and unsweetened drinks. The items listed above may not be a complete list of foods and beverages you can eat. Contact a dietitian for more information. What foods should I limit? Limit foods or drinks that are not good sources of vitamins, minerals, or protein or that are high in unhealthy fats. These  include: Candy. Other sweets. Sodas, specialty coffee drinks, alcohol, and juice. The items listed above may not be a complete list of foods and beverages you should avoid. Contact a dietitian for more information. How do I count calories when eating out? Pay attention to portions. Often, portions are much larger when eating out. Try these tips to keep portions smaller: Consider sharing a meal instead of getting your own. If you get your own meal, eat only half of it. Before you start eating, ask for a container and put half of your meal into it. When available, consider ordering smaller portions from the menu instead of full portions. Pay attention to your food and drink choices. Knowing the way food is cooked and what is included with the meal can help you eat fewer calories. If calories are listed on the menu, choose the lower-calorie options. Choose dishes that include vegetables, fruits, whole grains, low-fat dairy products, and lean proteins. Choose items that are boiled, broiled, grilled, or steamed. Avoid items that are buttered, battered, fried, or served with cream sauce. Items labeled as crispy are usually fried, unless stated otherwise. Choose water, low-fat milk,  unsweetened iced tea, or other drinks without added sugar. If you want an alcoholic beverage, choose a lower-calorie option, such as a glass of wine or light beer. Ask for dressings, sauces, and syrups on the side. These are usually high in calories, so you should limit the amount you eat. If you want a salad, choose a garden salad and ask for grilled meats. Avoid extra toppings such as bacon, cheese, or fried items. Ask for the dressing on the side, or ask for olive oil and vinegar or lemon to use as dressing. Estimate how many servings of a food you are given. Knowing serving sizes will help you be aware of how much food you are eating at restaurants. Where to find more information Centers for Disease Control and Prevention: FootballExhibition.com.br U.S. Department of Agriculture: WrestlingReporter.dk Summary Calorie counting means keeping track of how many calories you eat and drink each day. If you eat fewer calories than your body needs, you should lose weight. A healthy amount of weight to lose per week is usually 1-2 lb (0.5-0.9 kg). This usually means reducing your daily calorie intake by 500-750 calories. The number of calories in a food can be found on a Nutrition Facts label. If a food does not have a Nutrition Facts label, try to look up the calories online or ask your dietitian for help. Use smaller plates, glasses, and bowls for smaller portions and to prevent overeating. Use your calories on foods and drinks that will fill you up and not leave you hungry shortly after a meal. This information is not intended to replace advice given to you by your health care provider. Make sure you discuss any questions you have with your health care provider. Document Revised: 03/15/2019 Document Reviewed: 03/15/2019 Elsevier Patient Education  2023 [employer].

## 2022-12-06 DIAGNOSIS — K225 Diverticulum of esophagus, acquired: Secondary | ICD-10-CM | POA: Diagnosis not present

## 2022-12-06 DIAGNOSIS — K2100 Gastro-esophageal reflux disease with esophagitis, without bleeding: Secondary | ICD-10-CM | POA: Diagnosis not present

## 2022-12-06 DIAGNOSIS — K449 Diaphragmatic hernia without obstruction or gangrene: Secondary | ICD-10-CM | POA: Diagnosis not present

## 2022-12-06 DIAGNOSIS — K30 Functional dyspepsia: Secondary | ICD-10-CM | POA: Diagnosis not present

## 2022-12-07 ENCOUNTER — Other Ambulatory Visit: Payer: Self-pay | Admitting: Family Medicine

## 2022-12-07 DIAGNOSIS — E785 Hyperlipidemia, unspecified: Secondary | ICD-10-CM

## 2022-12-27 DIAGNOSIS — Z6831 Body mass index (BMI) 31.0-31.9, adult: Secondary | ICD-10-CM | POA: Diagnosis not present

## 2022-12-27 DIAGNOSIS — Z01419 Encounter for gynecological examination (general) (routine) without abnormal findings: Secondary | ICD-10-CM | POA: Diagnosis not present

## 2023-02-10 ENCOUNTER — Other Ambulatory Visit: Payer: Self-pay | Admitting: Family Medicine

## 2023-02-10 MED ORDER — ZEPBOUND 5 MG/0.5ML ~~LOC~~ SOAJ: 5.0000 mg | SUBCUTANEOUS | 0 refills | Status: DC

## 2023-02-14 ENCOUNTER — Other Ambulatory Visit: Payer: Self-pay | Admitting: Family Medicine

## 2023-02-14 DIAGNOSIS — K219 Gastro-esophageal reflux disease without esophagitis: Secondary | ICD-10-CM

## 2023-02-14 DIAGNOSIS — E785 Hyperlipidemia, unspecified: Secondary | ICD-10-CM

## 2023-02-14 MED ORDER — PANTOPRAZOLE SODIUM 40 MG PO TBEC
40.0000 mg | DELAYED_RELEASE_TABLET | Freq: Two times a day (BID) | ORAL | 3 refills | Status: DC
Start: 2023-02-14 — End: 2023-09-13

## 2023-03-22 DIAGNOSIS — K5732 Diverticulitis of large intestine without perforation or abscess without bleeding: Secondary | ICD-10-CM | POA: Diagnosis not present

## 2023-03-22 DIAGNOSIS — R1032 Left lower quadrant pain: Secondary | ICD-10-CM | POA: Diagnosis not present

## 2023-04-01 DIAGNOSIS — Z008 Encounter for other general examination: Secondary | ICD-10-CM | POA: Diagnosis not present

## 2023-04-01 DIAGNOSIS — K2100 Gastro-esophageal reflux disease with esophagitis, without bleeding: Secondary | ICD-10-CM | POA: Diagnosis not present

## 2023-04-01 DIAGNOSIS — K225 Diverticulum of esophagus, acquired: Secondary | ICD-10-CM | POA: Diagnosis not present

## 2023-04-01 DIAGNOSIS — K5790 Diverticulosis of intestine, part unspecified, without perforation or abscess without bleeding: Secondary | ICD-10-CM | POA: Diagnosis not present

## 2023-04-08 ENCOUNTER — Ambulatory Visit: Payer: BC Managed Care – PPO | Admitting: Family Medicine

## 2023-04-08 ENCOUNTER — Encounter: Payer: Self-pay | Admitting: Family Medicine

## 2023-04-08 ENCOUNTER — Other Ambulatory Visit (HOSPITAL_BASED_OUTPATIENT_CLINIC_OR_DEPARTMENT_OTHER): Payer: Self-pay

## 2023-04-08 VITALS — BP 120/70 | HR 82 | Temp 98.5°F | Resp 18 | Ht 65.0 in | Wt 179.4 lb

## 2023-04-08 DIAGNOSIS — J0140 Acute pansinusitis, unspecified: Secondary | ICD-10-CM | POA: Diagnosis not present

## 2023-04-08 DIAGNOSIS — J349 Unspecified disorder of nose and nasal sinuses: Secondary | ICD-10-CM

## 2023-04-08 DIAGNOSIS — I10 Essential (primary) hypertension: Secondary | ICD-10-CM

## 2023-04-08 LAB — POC INFLUENZA A&B (BINAX/QUICKVUE)
Influenza A, POC: NEGATIVE
Influenza B, POC: NEGATIVE

## 2023-04-08 LAB — POC COVID19 BINAXNOW: SARS Coronavirus 2 Ag: NEGATIVE

## 2023-04-08 MED ORDER — AMOXICILLIN-POT CLAVULANATE 875-125 MG PO TABS
1.0000 | ORAL_TABLET | Freq: Two times a day (BID) | ORAL | 0 refills | Status: DC
  Filled 2023-04-08: qty 20, 10d supply, fill #0

## 2023-04-08 MED ORDER — FLUTICASONE PROPIONATE 50 MCG/ACT NA SUSP
2.0000 | Freq: Every day | NASAL | 6 refills | Status: AC
  Filled 2023-04-08: qty 16, 30d supply, fill #0

## 2023-04-08 MED ORDER — AMLODIPINE BESYLATE 10 MG PO TABS
10.0000 mg | ORAL_TABLET | Freq: Every day | ORAL | 1 refills | Status: DC
Start: 2023-04-08 — End: 2023-09-06
  Filled 2023-04-08: qty 30, 30d supply, fill #0

## 2023-04-08 NOTE — Progress Notes (Signed)
 Established Patient Office Visit  Subjective   Patient ID: Diana Randall, female    DOB: 23-Jan-1961  Age: 63 y.o. MRN: [id number]  Chief Complaint  Patient presents with   Sinus Problem    Sxs started yesterday morning, dry cough, no bodyaches, fever, or chills. Pt states using Mucinex     HPI Discussed the use of AI scribe software for clinical note transcription with the patient, who gave verbal consent to proceed.  History of Present Illness   Diana Randall "Diana Randall" is a 63 year old female who presents with symptoms of a sinus infection.  She began feeling unwell yesterday morning, waking up with a sore throat and congestion. She attributes this to sleeping with her mouth open due to her husband's CPAP machine blowing cool air towards her. By the afternoon, she felt extremely fatigued, leading her to 'collapse' upon returning home.  No fever, body aches, or chills. Symptoms include a runny nose, particularly on the left side of her face, which feels full and pressured. She also experiences a dry cough, described as 'real dry,' and notes a tight feeling in her chest after coughing several times, although she feels the congestion is primarily in her head.  She has been using Mucinex for cold and flu symptoms, which she purchased on her way home yesterday. She considered purchasing Flonase but decided against it at the time.      Patient Active Problem List   Diagnosis Date Noted   Preventative health care 08/24/2022   Pneumonia of left upper lobe due to infectious organism 11/04/2020   Bronchospasm 11/04/2020   Influenza 11/04/2020   Morbid obesity (HCC) 09/19/2020   Hypokalemia 04/27/2019   History of COVID-19 04/27/2019   Hair loss 04/27/2019   Gastroesophageal reflux disease 04/27/2019   Essential tremor 04/27/2019   Cough 10/29/2016   Upper respiratory tract infection 10/29/2016   Dizzy 10/29/2016   HTN (hypertension) 11/06/2014   Acute bronchitis 03/09/2013    Obesity (BMI 30-39.9) 11/23/2012   Radicular leg pain 10/15/2010   Hyperlipidemia 01/07/2009   CHEST PAIN UNSPECIFIED 12/23/2008   SHOULDER PAIN, RIGHT 09/16/2008   MURMUR 01/01/2008   Essential hypertension 12/18/2007   Past Medical History:  Diagnosis Date   GERD (gastroesophageal reflux disease)    Hyperlipidemia    Hypertension    Past Surgical History:  Procedure Laterality Date   CESAREAN SECTION  1995   Social History   Tobacco Use   Smoking status: Never   Smokeless tobacco: Never  Substance Use Topics   Alcohol use: Yes   Drug use: No   Social History   Socioeconomic History   Marital status: Married    Spouse name: Not on file   Number of children: Not on file   Years of education: Not on file   Highest education level: Bachelor's degree (e.g., BA, AB, BS)  Occupational History   Occupation: [employer]    Employer: [employer]  Tobacco Use   Smoking status: Never   Smokeless tobacco: Never  Substance and Sexual Activity   Alcohol use: Yes   Drug use: No   Sexual activity: Not Currently    Partners: Male  Other Topics Concern   Not on file  Social History Narrative   Exercise-- no   Social Drivers of Health   Financial Resource Strain: Low Risk  (11/29/2022)   Overall Financial Resource Strain (CARDIA)    Difficulty of Paying Living Expenses: Not hard at all  Food Insecurity: No  Food Insecurity (11/29/2022)   Hunger Vital Sign    Worried About Running Out of Food in the Last Year: Never true    Ran Out of Food in the Last Year: Never true  Transportation Needs: No Transportation Needs (11/29/2022)   PRAPARE - Administrator, Civil Service (Medical): No    Lack of Transportation (Non-Medical): No  Physical Activity: Insufficiently Active (11/29/2022)   Exercise Vital Sign    Days of Exercise per Week: 3 days    Minutes of Exercise per Session: 30 min  Stress: No Stress Concern Present (11/29/2022)   [employer] of Occupational Health  - Occupational Stress Questionnaire    Feeling of Stress : Only a little  Social Connections: Socially Integrated (11/29/2022)   Social Connection and Isolation Panel [NHANES]    Frequency of Communication with Friends and Family: More than three times a week    Frequency of Social Gatherings with Friends and Family: Once a week    Attends Religious Services: More than 4 times per year    Active Member of [employer] or Organizations: Yes    Attends Banker Meetings: 1 to 4 times per year    Marital Status: Married  Catering manager Violence: Not on file   Family Status  Relation Name Status   Father  Deceased       covid   Brother  Deceased  No partnership data on file   Family History  Problem Relation Age of Onset   Stomach cancer Brother    Allergies  Allergen Reactions   Latex Rash      Review of Systems  Constitutional:  Negative for fever and malaise/fatigue.  HENT:  Positive for sinus pain and sore throat. Negative for congestion.   Eyes:  Negative for blurred vision.  Respiratory:  Positive for cough. Negative for sputum production, shortness of breath and wheezing.   Cardiovascular:  Negative for chest pain, palpitations and leg swelling.  Gastrointestinal:  Negative for vomiting.  Musculoskeletal:  Negative for back pain.  Skin:  Negative for rash.  Neurological:  Negative for loss of consciousness and headaches.      Objective:     BP 120/70 (BP Location: Left Arm, Patient Position: Sitting, Cuff Size: Normal)   Pulse 82   Temp 98.5 F (36.9 C) (Oral)   Resp 18   Ht 5\' 5"  (1.651 m)   Wt 179 lb 6.4 oz (81.4 kg)   SpO2 99%   BMI 29.85 kg/m  BP Readings from Last 3 Encounters:  04/08/23 120/70  11/30/22 120/86  08/24/22 134/86   Wt Readings from Last 3 Encounters:  04/08/23 179 lb 6.4 oz (81.4 kg)  11/30/22 193 lb 3.2 oz (87.6 kg)  08/24/22 210 lb (95.3 kg)   SpO2 Readings from Last 3 Encounters:  04/08/23 99%  11/30/22 98%  08/24/22  99%      Physical Exam Vitals and nursing note reviewed.  Constitutional:      General: She is not in acute distress.    Appearance: Normal appearance. She is well-developed.  HENT:     Head: Normocephalic and atraumatic.     Nose: Rhinorrhea present. No congestion.     Right Sinus: No maxillary sinus tenderness or frontal sinus tenderness.     Left Sinus: Maxillary sinus tenderness and frontal sinus tenderness present.  Eyes:     General: No scleral icterus.       Right eye: No discharge.  Left eye: No discharge.  Cardiovascular:     Rate and Rhythm: Normal rate and regular rhythm.     Heart sounds: No murmur heard. Pulmonary:     Effort: Pulmonary effort is normal. No respiratory distress.     Breath sounds: Normal breath sounds.  Musculoskeletal:        General: Normal range of motion.     Cervical back: Normal range of motion and neck supple.     Right lower leg: No edema.     Left lower leg: No edema.  Skin:    General: Skin is warm and dry.  Neurological:     Mental Status: She is alert and oriented to person, place, and time.  Psychiatric:        Mood and Affect: Mood normal.        Behavior: Behavior normal.        Thought Content: Thought content normal.        Judgment: Judgment normal.      Results for orders placed or performed in visit on 04/08/23  POC COVID-19  Result Value Ref Range   SARS Coronavirus 2 Ag Negative Negative  POC Influenza A&B (Binax test)  Result Value Ref Range   Influenza A, POC Negative Negative   Influenza B, POC Negative Negative    Last CBC Lab Results  Component Value Date   WBC 6.7 08/24/2022   HGB 13.7 08/24/2022   HCT 40.7 08/24/2022   MCV 88.3 08/24/2022   RDW 13.5 08/24/2022   PLT 292.0 08/24/2022   Last metabolic panel Lab Results  Component Value Date   GLUCOSE 87 11/30/2022   NA 141 11/30/2022   K 3.6 11/30/2022   CL 102 11/30/2022   CO2 31 11/30/2022   BUN 15 11/30/2022   CREATININE 0.93  11/30/2022   GFR 65.75 11/30/2022   CALCIUM 9.8 11/30/2022   PROT 6.8 11/30/2022   ALBUMIN 4.1 11/30/2022   BILITOT 0.8 11/30/2022   ALKPHOS 78 11/30/2022   AST 16 11/30/2022   ALT 14 11/30/2022   Last lipids Lab Results  Component Value Date   CHOL 216 (H) 11/30/2022   HDL 43.80 11/30/2022   LDLCALC 154 (H) 11/30/2022   LDLDIRECT 177.0 02/17/2021   TRIG 91.0 11/30/2022   CHOLHDL 5 11/30/2022   Last hemoglobin A1c No results found for: "HGBA1C" Last thyroid functions Lab Results  Component Value Date   TSH 2.20 08/24/2022   T4TOTAL 6.4 04/26/2019   Last vitamin D No results found for: "25OHVITD2", "25OHVITD3", "VD25OH" Last vitamin B12 and Folate Lab Results  Component Value Date   VITAMINB12 220 04/26/2019  Neg flu and covid     The ASCVD Risk score (Arnett DK, et al., 2019) failed to calculate for the following reasons:   Unable to determine if patient is Non-Hispanic African American    Assessment & Plan:   Problem List Items Addressed This Visit       Unprioritized   Essential hypertension   Relevant Medications   amLODipine (NORVASC) 10 MG tablet   Other Relevant Orders   POC Influenza A&B (Binax test) (Completed)   Other Visit Diagnoses       Sinus problem    -  Primary   Relevant Orders   POC COVID-19 (Completed)     Acute non-recurrent pansinusitis       Relevant Medications   amoxicillin-clavulanate (AUGMENTIN) 875-125 MG tablet   fluticasone (FLONASE) 50 MCG/ACT nasal spray     Assessment  and Plan    Acute Sinusitis Symptoms began yesterday, including sore throat, congestion, rhinorrhea, left-sided facial pressure, and dry cough, without fever, myalgia, or chills. Physical exam reveals swollen glands and clear lungs. COVID-19 and influenza tests are negative. Prescribe an antibiotic and Flonase. Advise Mucinex for symptom relief. Instruct to call if the cough worsens.  General Health Maintenance Discussed the importance of avoiding  illness exposure, especially at work, and maintaining good hygiene. Advise using Lysol to disinfect the work area. Encourage working from home if unwell.        No follow-ups on file.    [name], DO

## 2023-04-08 NOTE — Patient Instructions (Signed)

## 2023-05-20 ENCOUNTER — Other Ambulatory Visit: Payer: Self-pay | Admitting: Family Medicine

## 2023-06-01 DIAGNOSIS — H43811 Vitreous degeneration, right eye: Secondary | ICD-10-CM | POA: Diagnosis not present

## 2023-06-01 DIAGNOSIS — H52223 Regular astigmatism, bilateral: Secondary | ICD-10-CM | POA: Diagnosis not present

## 2023-06-01 DIAGNOSIS — H524 Presbyopia: Secondary | ICD-10-CM | POA: Diagnosis not present

## 2023-06-01 DIAGNOSIS — H35371 Puckering of macula, right eye: Secondary | ICD-10-CM | POA: Diagnosis not present

## 2023-06-01 DIAGNOSIS — H2513 Age-related nuclear cataract, bilateral: Secondary | ICD-10-CM | POA: Diagnosis not present

## 2023-06-01 DIAGNOSIS — H5213 Myopia, bilateral: Secondary | ICD-10-CM | POA: Diagnosis not present

## 2023-06-09 ENCOUNTER — Other Ambulatory Visit: Payer: Self-pay | Admitting: Family Medicine

## 2023-06-09 DIAGNOSIS — I10 Essential (primary) hypertension: Secondary | ICD-10-CM

## 2023-07-07 IMAGING — CT CT ABD-PELV W/ CM
2 of 5 series · 16 of 46 positions shown, 18 images · IV contrast (Omnipaque)
Comparison: CT February 12, 2020

CLINICAL DATA: Left lower quadrant abdominal pain, recent diagnosis
of diverticulitis.

EXAM:
CT ABDOMEN AND PELVIS WITH CONTRAST
TECHNIQUE: Multidetector CT imaging of the abdomen and pelvis was performed
using the standard protocol following bolus administration of
intravenous contrast.

[Series 2: axial st · axial · 0.98mm/px · z∈[+793,+1188]mm · 13 of 89 slices shown, 15 images]
[im 5/89  soft-tissue]
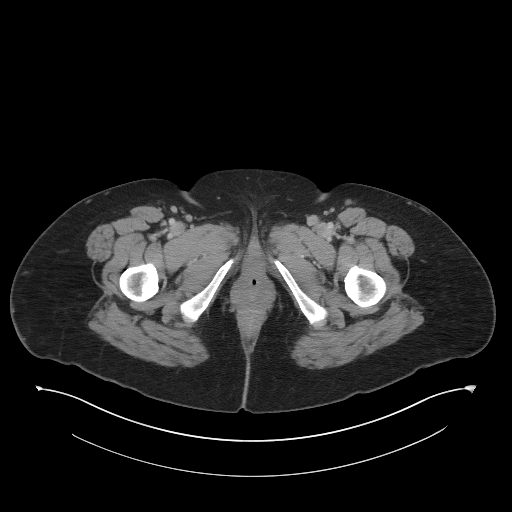
[im 5/89  bone]
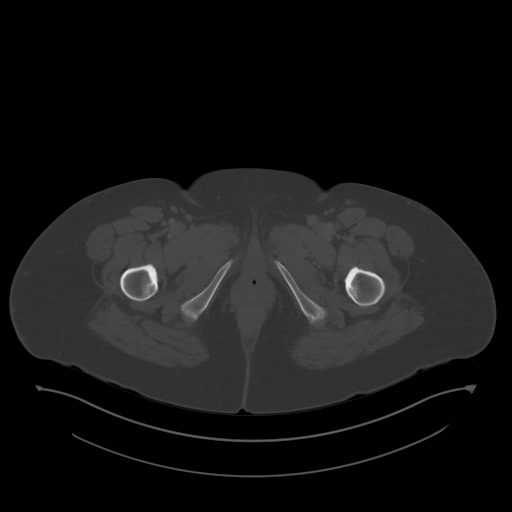
[im 10/89  soft-tissue]
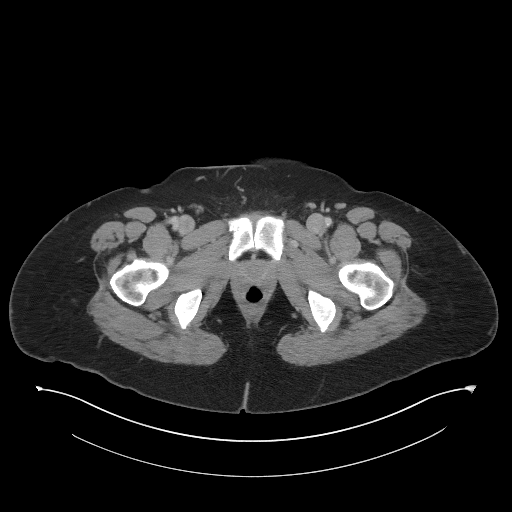
[im 20/89  soft-tissue]
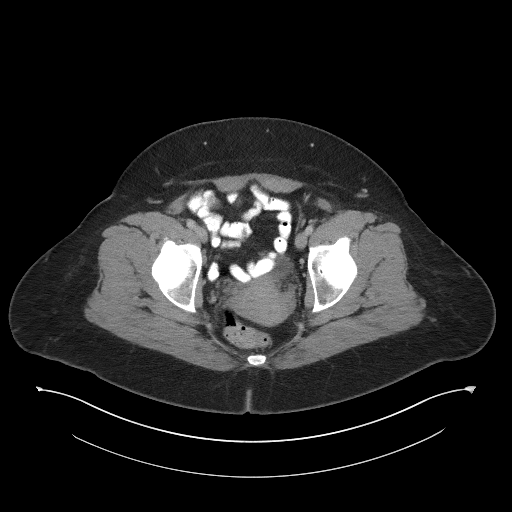
[im 25/89  soft-tissue]
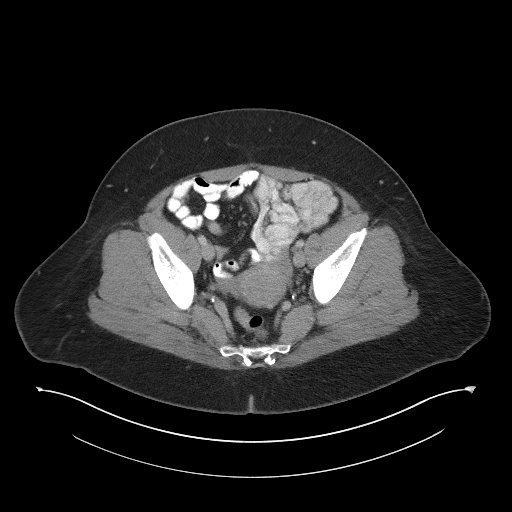
[im 30/89  soft-tissue]
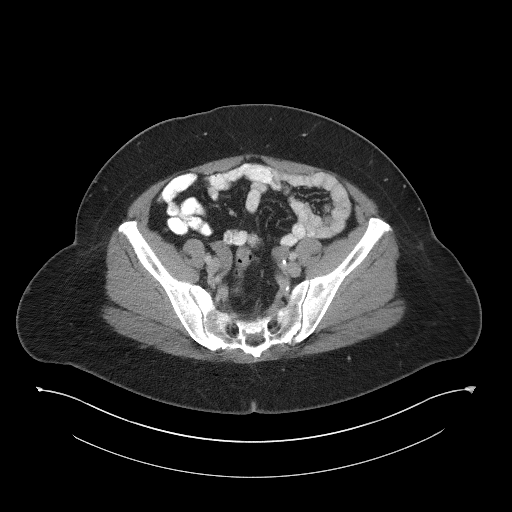
[im 40/89  soft-tissue]
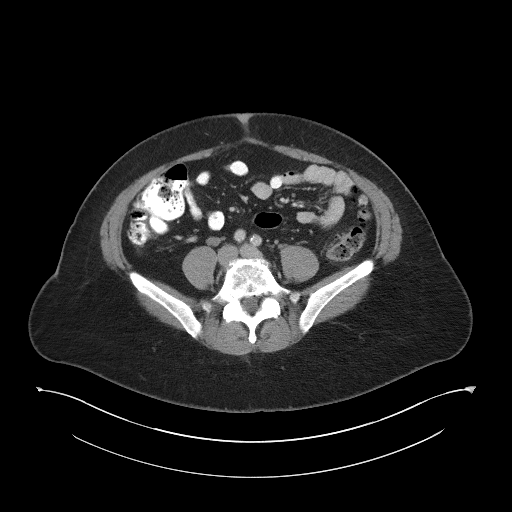
[im 45/89  soft-tissue]
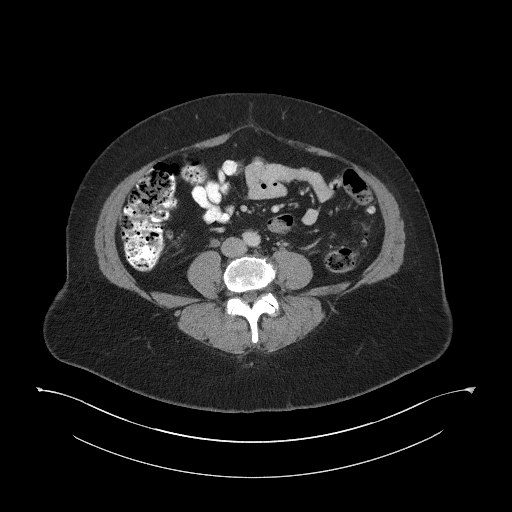
[im 49/89  soft-tissue]
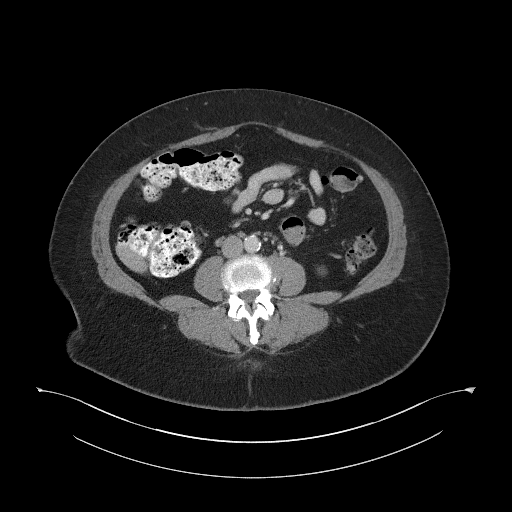
[im 59/89  soft-tissue]
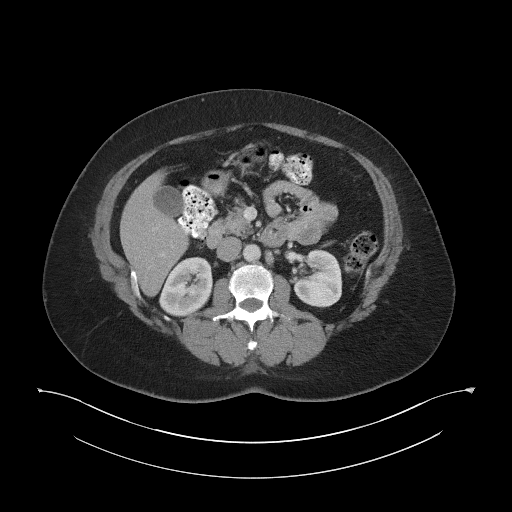
[im 59/89  bone]
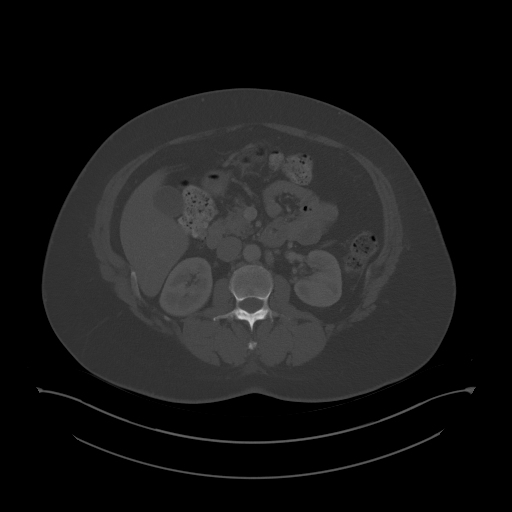
[im 64/89  soft-tissue]
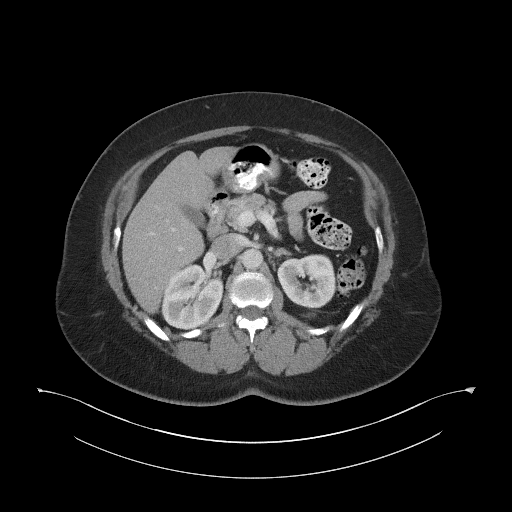
[im 69/89  soft-tissue]
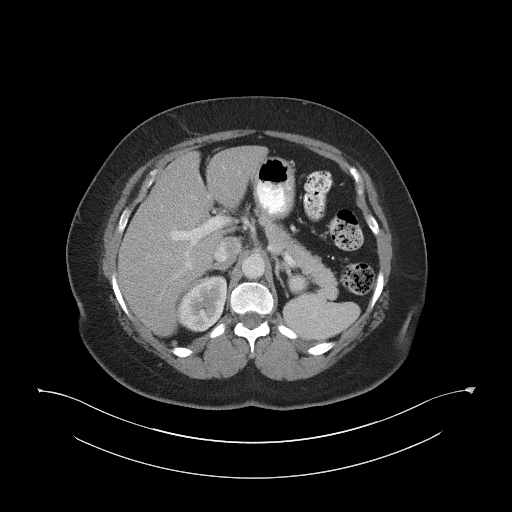
[im 79/89  soft-tissue]
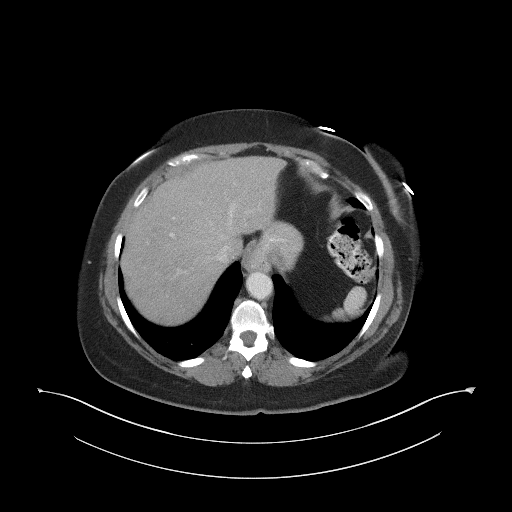
[im 84/89  soft-tissue]
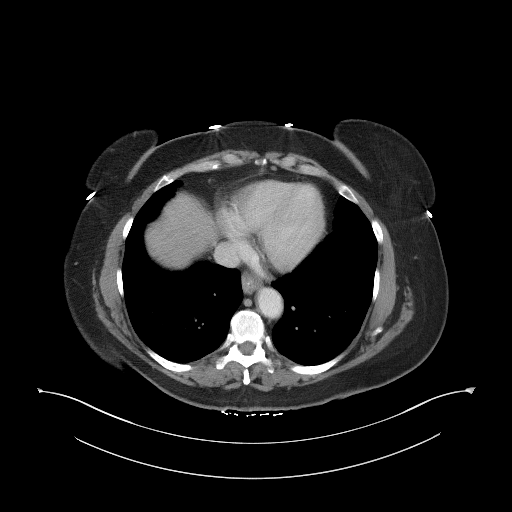

[Series 5: coronal st · coronal · 0.88mm/px · 3 of 113 slices shown]
[im 38/113  soft-tissue]
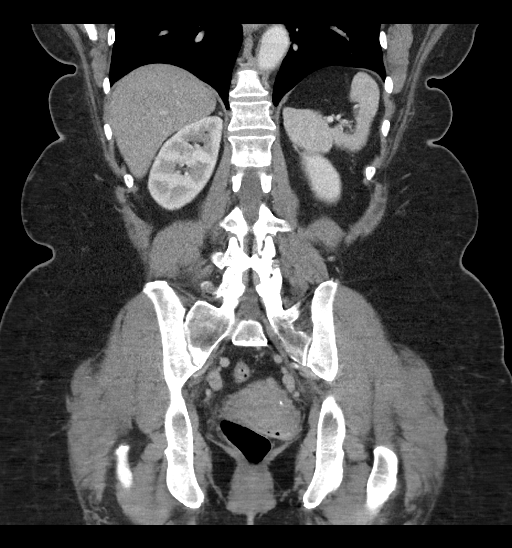
[im 50/113  soft-tissue]
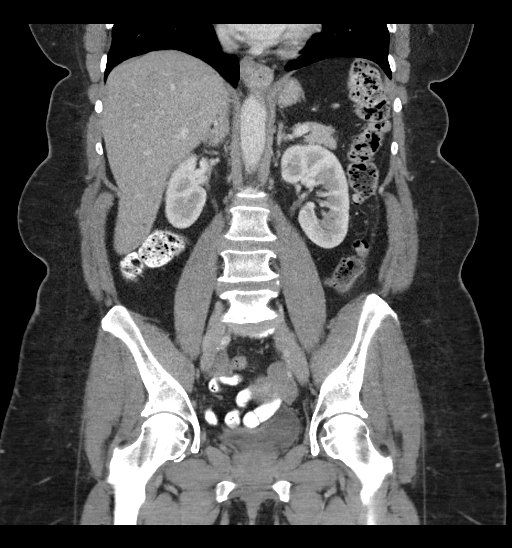
[im 63/113  soft-tissue]
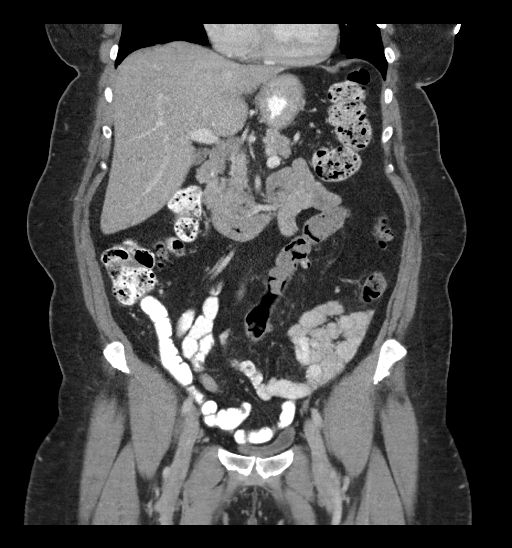

[16 of 46 positions shown; findings below may reference images not displayed]

RADIATION DOSE REDUCTION: This exam was performed according to the
departmental dose-optimization program which includes automated
exposure control, adjustment of the mA and/or kV according to
patient size and/or use of iterative reconstruction technique.

CONTRAST:  100mL OMNIPAQUE IOHEXOL 300 MG/ML  SOLN
FINDINGS: Lower chest: No acute abnormality.

Hepatobiliary: Probable diffuse hepatic steatosis. No suspicious
hepatic lesion. Gallbladder is unremarkable. No biliary ductal
dilation.

Pancreas: No pancreatic ductal dilation or evidence of acute
inflammation.

Spleen: No splenomegaly or focal splenic lesion.

Adrenals/Urinary Tract: Bilateral adrenal glands appear normal.
Kidneys demonstrate symmetric enhancement and excretion of contrast.
No hydronephrosis. Urinary bladder is unremarkable for degree of
distension.

Stomach/Bowel: Radiopaque enteric contrast material traverses the
splenic flexure. Moderate hiatal hernia otherwise the stomach is
unremarkable for degree of distension. No pathologic dilation of
small or large bowel. Moderate volume of formed stool throughout the
colon. Left-sided colonic diverticulosis with acute sigmoid
diverticulitis.

Vascular/Lymphatic: Aortic and branch vessel atherosclerosis without
abdominal aortic aneurysm. No pathologically enlarged abdominal or
pelvic lymph nodes.

Reproductive: Uterus and bilateral adnexa are unremarkable.

Other: No walled off fluid collection. No significant abdominopelvic
free fluid. No pneumoperitoneum.

Musculoskeletal: L5-S1 discogenic disease. Degenerative changes
bilateral hips. Similar asymmetric sclerosis of the right SI joint.
IMPRESSION: 1. Acute uncomplicated sigmoid diverticulitis.
2. Moderate hiatal hernia.
3. Probable diffuse hepatic steatosis.
4.  Aortic Atherosclerosis ([id number]).

## 2023-08-10 DIAGNOSIS — K449 Diaphragmatic hernia without obstruction or gangrene: Secondary | ICD-10-CM | POA: Diagnosis not present

## 2023-08-10 DIAGNOSIS — K219 Gastro-esophageal reflux disease without esophagitis: Secondary | ICD-10-CM | POA: Diagnosis not present

## 2023-08-10 DIAGNOSIS — K225 Diverticulum of esophagus, acquired: Secondary | ICD-10-CM | POA: Diagnosis not present

## 2023-08-10 DIAGNOSIS — K2100 Gastro-esophageal reflux disease with esophagitis, without bleeding: Secondary | ICD-10-CM | POA: Diagnosis not present

## 2023-08-10 DIAGNOSIS — K30 Functional dyspepsia: Secondary | ICD-10-CM | POA: Diagnosis not present

## 2023-08-24 ENCOUNTER — Encounter: Payer: Self-pay | Admitting: Family Medicine

## 2023-08-24 MED ORDER — ZEPBOUND 5 MG/0.5ML ~~LOC~~ SOAJ: 5.0000 mg | SUBCUTANEOUS | 0 refills | Status: DC

## 2023-09-01 DIAGNOSIS — Z1231 Encounter for screening mammogram for malignant neoplasm of breast: Secondary | ICD-10-CM | POA: Diagnosis not present

## 2023-09-01 LAB — HM MAMMOGRAPHY

## 2023-09-05 ENCOUNTER — Encounter: Payer: Self-pay | Admitting: Family Medicine

## 2023-09-06 ENCOUNTER — Telehealth: Payer: Self-pay

## 2023-09-06 DIAGNOSIS — I10 Essential (primary) hypertension: Secondary | ICD-10-CM

## 2023-09-06 DIAGNOSIS — K2100 Gastro-esophageal reflux disease with esophagitis, without bleeding: Secondary | ICD-10-CM | POA: Diagnosis not present

## 2023-09-06 DIAGNOSIS — K5790 Diverticulosis of intestine, part unspecified, without perforation or abscess without bleeding: Secondary | ICD-10-CM | POA: Diagnosis not present

## 2023-09-06 DIAGNOSIS — E785 Hyperlipidemia, unspecified: Secondary | ICD-10-CM

## 2023-09-06 MED ORDER — AMLODIPINE BESYLATE 10 MG PO TABS: 10.0000 mg | ORAL_TABLET | Freq: Every day | ORAL | 0 refills | Status: DC

## 2023-09-06 MED ORDER — ROSUVASTATIN CALCIUM 20 MG PO TABS: 20.0000 mg | ORAL_TABLET | Freq: Every day | ORAL | 0 refills | Status: DC

## 2023-09-06 NOTE — Telephone Encounter (Signed)
 Copied from CRM [id number]. Topic: Clinical - Prescription Issue >> Sep 06, 2023  3:16 PM Chasity T wrote: Reason for CRM: [facility] has called in for patient to expedite medication request for rosuvastatin  (CRESTOR ) 20 MG tablet and amLODipine  (NORVASC ) 10 MG tablet. States multiple faxes has been sent out.  Fax: [phone] Phone: [phone]

## 2023-09-06 NOTE — Telephone Encounter (Signed)
 Rxs sent

## 2023-09-13 ENCOUNTER — Ambulatory Visit: Admitting: Family Medicine

## 2023-09-13 ENCOUNTER — Ambulatory Visit: Payer: Self-pay | Admitting: Family Medicine

## 2023-09-13 ENCOUNTER — Encounter: Payer: Self-pay | Admitting: Family Medicine

## 2023-09-13 VITALS — BP 114/68 | HR 80 | Temp 98.6°F | Resp 16 | Ht 65.0 in | Wt 175.8 lb

## 2023-09-13 DIAGNOSIS — I10 Essential (primary) hypertension: Secondary | ICD-10-CM

## 2023-09-13 DIAGNOSIS — E785 Hyperlipidemia, unspecified: Secondary | ICD-10-CM

## 2023-09-13 DIAGNOSIS — M546 Pain in thoracic spine: Secondary | ICD-10-CM

## 2023-09-13 DIAGNOSIS — K219 Gastro-esophageal reflux disease without esophagitis: Secondary | ICD-10-CM | POA: Diagnosis not present

## 2023-09-13 DIAGNOSIS — E876 Hypokalemia: Secondary | ICD-10-CM

## 2023-09-13 DIAGNOSIS — G8929 Other chronic pain: Secondary | ICD-10-CM | POA: Insufficient documentation

## 2023-09-13 LAB — CBC WITH DIFFERENTIAL/PLATELET
Basophils Absolute: 0.1 K/uL (ref 0.0–0.1)
Basophils Relative: 1.2 % (ref 0.0–3.0)
Eosinophils Absolute: 0.2 K/uL (ref 0.0–0.7)
Eosinophils Relative: 2.2 % (ref 0.0–5.0)
HCT: 41.1 % (ref 36.0–46.0)
Hemoglobin: 14 g/dL (ref 12.0–15.0)
Lymphocytes Relative: 34.4 % (ref 12.0–46.0)
Lymphs Abs: 2.5 K/uL (ref 0.7–4.0)
MCHC: 34.1 g/dL (ref 30.0–36.0)
MCV: 87 fl (ref 78.0–100.0)
Monocytes Absolute: 0.3 K/uL (ref 0.1–1.0)
Monocytes Relative: 4.3 % (ref 3.0–12.0)
Neutro Abs: 4.1 K/uL (ref 1.4–7.7)
Neutrophils Relative %: 57.9 % (ref 43.0–77.0)
Platelets: 324 K/uL (ref 150.0–400.0)
RBC: 4.72 Mil/uL (ref 3.87–5.11)
RDW: 13 % (ref 11.5–15.5)
WBC: 7.2 K/uL (ref 4.0–10.5)

## 2023-09-13 LAB — COMPREHENSIVE METABOLIC PANEL WITH GFR
ALT: 20 U/L (ref 0–35)
AST: 21 U/L (ref 0–37)
Albumin: 4.1 g/dL (ref 3.5–5.2)
Alkaline Phosphatase: 84 U/L (ref 39–117)
BUN: 15 mg/dL (ref 6–23)
CO2: 31 meq/L (ref 19–32)
Calcium: 10 mg/dL (ref 8.4–10.5)
Chloride: 99 meq/L (ref 96–112)
Creatinine, Ser: 0.86 mg/dL (ref 0.40–1.20)
GFR: 71.82 mL/min (ref >60.00)
Glucose, Bld: 78 mg/dL (ref 70–99)
Potassium: 3.2 meq/L — ABNORMAL LOW (ref 3.5–5.1)
Sodium: 139 meq/L (ref 135–145)
Total Bilirubin: 0.7 mg/dL (ref 0.2–1.2)
Total Protein: 6.6 g/dL (ref 6.0–8.3)

## 2023-09-13 LAB — LIPID PANEL
Cholesterol: 177 mg/dL (ref 0–200)
HDL: 55.8 mg/dL (ref >39.00)
LDL Cholesterol: 99 mg/dL (ref 0–99)
NonHDL: 121.15
Total CHOL/HDL Ratio: 3
Triglycerides: 111 mg/dL (ref 0.0–149.0)
VLDL: 22.2 mg/dL (ref 0.0–40.0)

## 2023-09-13 LAB — TSH: TSH: 2.55 u[IU]/mL (ref 0.35–5.50)

## 2023-09-13 MED ORDER — ROSUVASTATIN CALCIUM 20 MG PO TABS: 20.0000 mg | ORAL_TABLET | Freq: Every day | ORAL | 3 refills | Status: AC

## 2023-09-13 MED ORDER — AMLODIPINE BESYLATE 10 MG PO TABS: 10.0000 mg | ORAL_TABLET | Freq: Every day | ORAL | 3 refills | Status: AC

## 2023-09-13 MED ORDER — POTASSIUM CHLORIDE ER 10 MEQ PO TBCR: EXTENDED_RELEASE_TABLET | ORAL | 3 refills | Status: AC

## 2023-09-13 MED ORDER — PANTOPRAZOLE SODIUM 40 MG PO TBEC: 40.0000 mg | DELAYED_RELEASE_TABLET | Freq: Two times a day (BID) | ORAL | 3 refills | Status: AC

## 2023-09-13 MED ORDER — ZEPBOUND 5 MG/0.5ML ~~LOC~~ SOAJ: 5.0000 mg | SUBCUTANEOUS | 1 refills | Status: DC

## 2023-09-13 MED ORDER — OLMESARTAN MEDOXOMIL-HCTZ 40-25 MG PO TABS: 1.0000 | ORAL_TABLET | Freq: Every day | ORAL | 3 refills | Status: AC

## 2023-09-13 NOTE — Assessment & Plan Note (Signed)
 IB/ tylenol Massage / chiropractor  Return to office as needed  Ice/ heat

## 2023-09-13 NOTE — Assessment & Plan Note (Signed)
 Well controlled, no changes to meds. Encouraged heart healthy diet such as the DASH diet and exercise as tolerated.

## 2023-09-13 NOTE — Assessment & Plan Note (Signed)
 F/u GI Con't pantoprazole  bid

## 2023-09-13 NOTE — Progress Notes (Signed)
 Subjective:    Patient ID: Diana Randall, female    DOB: Oct 24, 1960, 63 y.o.   MRN: [id number]  Chief Complaint  Patient presents with   Hypertension    Here for follow up   Shoulder Pain    Complains of right shoulder pain    HPI Patient is in today for f/u bp, cholesterol and gerd.  She also c/o r shoulder pain.  Discussed the use of AI scribe software for clinical note transcription with the patient, who gave verbal consent to proceed.  History of Present Illness Diana Randall is a 63 year old female who presents with reflux symptoms and shoulder pain.  She experiences reflux symptoms that fluctuate, describing them as a 'roller coaster' with periods of improvement and worsening. Symptoms are related to dietary choices and stress, with identified triggers including chocolate, [place] food, and eating late at night. Drinking water at night and lying down can aggravate her symptoms. Despite dietary adjustments, she sometimes feels food stuck in her throat. An endoscopy last year showed a full stomach; she reports that her medication Zetbon was stopped five weeks prior to the procedure this time. She has been on pantoprazole  (Protonix ).  She describes shoulder pain occurring once a day, typically after work, located in the right shoulder blade area. The pain is an ache that sometimes requires Tylenol for relief and is associated with poor posture from working at a computer. Despite adjusting her workspace, she continues to experience discomfort.  She mentions a history of hearing issues, noting a hereditary component. A hearing test indicated a dip between her inner and outer ear, sometimes causing balance issues. She experiences a sensation of fullness in her head and difficulty hearing, similar to needing to 'pop' her ears as one would on an airplane.  She is currently taking Zepbound  and notes a period of readjustment when restarting the medication after a break. She  identifies as a stress eater, which exacerbates her reflux symptoms when not on medication.    Past Medical History:  Diagnosis Date   GERD (gastroesophageal reflux disease)    Hyperlipidemia    Hypertension     Past Surgical History:  Procedure Laterality Date   CESAREAN SECTION  1995    Family History  Problem Relation Age of Onset   Stomach cancer Brother     Social History   Socioeconomic History   Marital status: Married    Spouse name: Not on file   Number of children: Not on file   Years of education: Not on file   Highest education level: Bachelor's degree (e.g., BA, AB, BS)  Occupational History   Occupation: [employer]    Employer: [employer]  Tobacco Use   Smoking status: Never   Smokeless tobacco: Never  Substance and Sexual Activity   Alcohol use: Yes   Drug use: No   Sexual activity: Not Currently    Partners: Male  Other Topics Concern   Not on file  Social History Narrative   Exercise-- no   Social Drivers of Health   Financial Resource Strain: Low Risk  (09/12/2023)   Overall Financial Resource Strain (CARDIA)    Difficulty of Paying Living Expenses: Not hard at all  Food Insecurity: No Food Insecurity (09/12/2023)   Hunger Vital Sign    Worried About Running Out of Food in the Last Year: Never true    Ran Out of Food in the Last Year: Never true  Transportation Needs: No Transportation Needs (09/12/2023)  PRAPARE - Administrator, Civil Service (Medical): No    Lack of Transportation (Non-Medical): No  Physical Activity: Insufficiently Active (09/12/2023)   Exercise Vital Sign    Days of Exercise per Week: 3 days    Minutes of Exercise per Session: 30 min  Stress: No Stress Concern Present (09/12/2023)   [employer] of Occupational Health - Occupational Stress Questionnaire    Feeling of Stress: Only a little  Social Connections: Socially Integrated (09/12/2023)   Social Connection and Isolation Panel    Frequency of Communication  with Friends and Family: Twice a week    Frequency of Social Gatherings with Friends and Family: Once a week    Attends Religious Services: More than 4 times per year    Active Member of [employer] or Organizations: Yes    Attends Banker Meetings: 1 to 4 times per year    Marital Status: Married  Catering manager Violence: Not on file    Outpatient Medications Prior to Visit  Medication Sig Dispense Refill   fluticasone  (FLONASE ) 50 MCG/ACT nasal spray Place 2 sprays into both nostrils daily. 16 g 6   amLODipine  (NORVASC ) 10 MG tablet Take 1 tablet (10 mg total) by mouth daily. 90 tablet 0   olmesartan -hydrochlorothiazide  (BENICAR  HCT) 40-25 MG tablet Take 1 tablet by mouth daily. 90 tablet 0   pantoprazole  (PROTONIX ) 40 MG tablet Take 1 tablet (40 mg total) by mouth 2 (two) times daily before a meal. 180 tablet 3   potassium chloride  (KLOR-CON ) 10 MEQ tablet 2 po qd 180 tablet 1   rosuvastatin  (CRESTOR ) 20 MG tablet Take 1 tablet (20 mg total) by mouth daily. 90 tablet 0   tirzepatide  (ZEPBOUND ) 5 MG/0.5ML Pen Inject 5 mg into the skin once a week. 2 mL 0   amoxicillin -clavulanate (AUGMENTIN ) 875-125 MG tablet Take 1 tablet by mouth 2 (two) times daily. 20 tablet 0   No facility-administered medications prior to visit.    Allergies  Allergen Reactions   Latex Rash    Review of Systems  Constitutional:  Negative for chills, fever and malaise/fatigue.  HENT:  Negative for congestion and hearing loss.   Eyes:  Negative for discharge.  Respiratory:  Negative for cough, sputum production and shortness of breath.   Cardiovascular:  Negative for chest pain, palpitations and leg swelling.  Gastrointestinal:  Negative for abdominal pain, blood in stool, constipation, diarrhea, heartburn, nausea and vomiting.  Genitourinary:  Negative for hematuria.  Musculoskeletal:  Negative for back pain, falls and myalgias.  Skin:  Negative for rash.  Neurological:  Negative for dizziness,  sensory change, loss of consciousness, weakness and headaches.  Endo/Heme/Allergies:  Negative for environmental allergies. Does not bruise/bleed easily.  Psychiatric/Behavioral:  Negative for depression and suicidal ideas. The patient is not nervous/anxious and does not have insomnia.        Objective:    Physical Exam Vitals and nursing note reviewed.  Constitutional:      General: She is not in acute distress.    Appearance: Normal appearance. She is well-developed.  HENT:     Head: Normocephalic and atraumatic.  Eyes:     General: No scleral icterus.       Right eye: No discharge.        Left eye: No discharge.  Cardiovascular:     Rate and Rhythm: Normal rate and regular rhythm.     Heart sounds: No murmur heard. Pulmonary:     Effort: Pulmonary effort  is normal. No respiratory distress.     Breath sounds: Normal breath sounds.  Musculoskeletal:        General: Normal range of motion.       Arms:     Cervical back: Normal range of motion and neck supple.     Right lower leg: No edema.     Left lower leg: No edema.     Comments: + muscle spasm/ knots R side spine T4-7 Myofascial massage and hvla used in the area with good results   Skin:    General: Skin is warm and dry.  Neurological:     Mental Status: She is alert and oriented to person, place, and time.  Psychiatric:        Mood and Affect: Mood normal.        Behavior: Behavior normal.        Thought Content: Thought content normal.        Judgment: Judgment normal.     BP 114/68 (BP Location: Left Arm, Patient Position: Sitting, Cuff Size: Small)   Pulse 80   Temp 98.6 F (37 C) (Oral)   Resp 16   Ht 5' 5 (1.651 m)   Wt 175 lb 12.8 oz (79.7 kg)   SpO2 100%   BMI 29.25 kg/m  Wt Readings from Last 3 Encounters:  09/13/23 175 lb 12.8 oz (79.7 kg)  04/08/23 179 lb 6.4 oz (81.4 kg)  11/30/22 193 lb 3.2 oz (87.6 kg)    Diabetic Foot Exam - Simple   No data filed    Lab Results  Component Value  Date   WBC 6.7 08/24/2022   HGB 13.7 08/24/2022   HCT 40.7 08/24/2022   PLT 292.0 08/24/2022   GLUCOSE 87 11/30/2022   CHOL 216 (H) 11/30/2022   TRIG 91.0 11/30/2022   HDL 43.80 11/30/2022   LDLDIRECT 177.0 02/17/2021   LDLCALC 154 (H) 11/30/2022   ALT 14 11/30/2022   AST 16 11/30/2022   NA 141 11/30/2022   K 3.6 11/30/2022   CL 102 11/30/2022   CREATININE 0.93 11/30/2022   BUN 15 11/30/2022   CO2 31 11/30/2022   TSH 2.20 08/24/2022    Lab Results  Component Value Date   TSH 2.20 08/24/2022   Lab Results  Component Value Date   WBC 6.7 08/24/2022   HGB 13.7 08/24/2022   HCT 40.7 08/24/2022   MCV 88.3 08/24/2022   PLT 292.0 08/24/2022   Lab Results  Component Value Date   NA 141 11/30/2022   K 3.6 11/30/2022   CO2 31 11/30/2022   GLUCOSE 87 11/30/2022   BUN 15 11/30/2022   CREATININE 0.93 11/30/2022   BILITOT 0.8 11/30/2022   ALKPHOS 78 11/30/2022   AST 16 11/30/2022   ALT 14 11/30/2022   PROT 6.8 11/30/2022   ALBUMIN 4.1 11/30/2022   CALCIUM  9.8 11/30/2022   GFR 65.75 11/30/2022   Lab Results  Component Value Date   CHOL 216 (H) 11/30/2022   Lab Results  Component Value Date   HDL 43.80 11/30/2022   Lab Results  Component Value Date   LDLCALC 154 (H) 11/30/2022   Lab Results  Component Value Date   TRIG 91.0 11/30/2022   Lab Results  Component Value Date   CHOLHDL 5 11/30/2022   No results found for: HGBA1C     Assessment & Plan:  Essential hypertension Assessment & Plan: Well controlled, no changes to meds. Encouraged heart healthy diet such as the DASH  diet and exercise as tolerated.    Orders: -     amLODIPine  Besylate; Take 1 tablet (10 mg total) by mouth daily.  Dispense: 90 tablet; Refill: 3 -     Olmesartan  Medoxomil-HCTZ; Take 1 tablet by mouth daily.  Dispense: 90 tablet; Refill: 3 -     CBC with Differential/Platelet -     Comprehensive metabolic panel with GFR -     Lipid panel -     TSH  Morbid obesity (HCC) -      Zepbound ; Inject 5 mg into the skin once a week.  Dispense: 6 mL; Refill: 1  Gastroesophageal reflux disease, unspecified whether esophagitis present Assessment & Plan: F/u GI Con't pantoprazole  bid   Orders: -     Pantoprazole  Sodium; Take 1 tablet (40 mg total) by mouth 2 (two) times daily before a meal.  Dispense: 180 tablet; Refill: 3  Hypokalemia -     Potassium Chloride  ER; 2 po qd  Dispense: 180 tablet; Refill: 3 -     Comprehensive metabolic panel with GFR  Hyperlipidemia, unspecified hyperlipidemia type -     Rosuvastatin  Calcium ; Take 1 tablet (20 mg total) by mouth daily.  Dispense: 90 tablet; Refill: 3 -     CBC with Differential/Platelet -     Comprehensive metabolic panel with GFR -     Lipid panel -     TSH  Chronic right-sided thoracic back pain Assessment & Plan: IB/ tylenol Massage / chiropractor  Return to office as needed  Ice/ heat   Orders: -     Ambulatory referral to Chiropractic  Assessment and Plan Assessment & Plan Gastroesophageal reflux disease (GERD)   She experiences intermittent GERD symptoms, including a sensation of food in the throat, which may be exacerbated by stress and dietary factors. Previous endoscopy showed a full stomach, but the recent one was normal. The surgeon advised against further invasive procedures due to lack of evidence. There is a discussion of a potential esophageal motility disorder. Current management includes pantoprazole . Continue pantoprazole , consider increasing to twice daily or adding Pepcid. Monitor dietary triggers and manage stress. Follow up with a GI specialist regarding a potential esophageal motility study.  Obesity   Zepbound  has effectively managed her stress eating, but recent discontinuation led to increased stress eating and exacerbation of GERD symptoms. Continue Zepbound  for weight management.  Right upper back pain due to muscle spasm/postural strain   She has intermittent right upper back pain  likely due to muscle spasm and poor posture from prolonged computer use. Pain is localized to the right upper back. Examination revealed tightness in the area. Refer to a chiropractor for evaluation and treatment. Recommend ibuprofen or Tylenol for pain management. Advise warm compresses or heating pad, and ice application as needed. Suggest massage therapy for muscle spasms.  Hearing issues   Hearing issues may be related to fluid in the ear, with a previous test showing a dip between the inner and outer ear. Consider using Claritin or Allegra (plain) and Flonase  for potential fluid in the ear.  Prescription management   Coordinate care with the GI specialist and surgeon for GERD management. Ensure all prescriptions are transferred to [facility].    Sorina Derrig R Lowne Chase, DO

## 2023-09-14 DIAGNOSIS — M9902 Segmental and somatic dysfunction of thoracic region: Secondary | ICD-10-CM | POA: Diagnosis not present

## 2023-09-14 DIAGNOSIS — M531 Cervicobrachial syndrome: Secondary | ICD-10-CM | POA: Diagnosis not present

## 2023-09-14 DIAGNOSIS — M9901 Segmental and somatic dysfunction of cervical region: Secondary | ICD-10-CM | POA: Diagnosis not present

## 2023-09-14 DIAGNOSIS — M9908 Segmental and somatic dysfunction of rib cage: Secondary | ICD-10-CM | POA: Diagnosis not present

## 2023-09-14 DIAGNOSIS — M62830 Muscle spasm of back: Secondary | ICD-10-CM | POA: Diagnosis not present

## 2023-09-20 DIAGNOSIS — M62830 Muscle spasm of back: Secondary | ICD-10-CM | POA: Diagnosis not present

## 2023-09-20 DIAGNOSIS — M531 Cervicobrachial syndrome: Secondary | ICD-10-CM | POA: Diagnosis not present

## 2023-09-20 DIAGNOSIS — M9901 Segmental and somatic dysfunction of cervical region: Secondary | ICD-10-CM | POA: Diagnosis not present

## 2023-09-20 DIAGNOSIS — M9902 Segmental and somatic dysfunction of thoracic region: Secondary | ICD-10-CM | POA: Diagnosis not present

## 2023-09-20 DIAGNOSIS — M9908 Segmental and somatic dysfunction of rib cage: Secondary | ICD-10-CM | POA: Diagnosis not present

## 2023-09-26 DIAGNOSIS — M531 Cervicobrachial syndrome: Secondary | ICD-10-CM | POA: Diagnosis not present

## 2023-09-26 DIAGNOSIS — M9901 Segmental and somatic dysfunction of cervical region: Secondary | ICD-10-CM | POA: Diagnosis not present

## 2023-09-26 DIAGNOSIS — M9902 Segmental and somatic dysfunction of thoracic region: Secondary | ICD-10-CM | POA: Diagnosis not present

## 2023-09-26 DIAGNOSIS — M9908 Segmental and somatic dysfunction of rib cage: Secondary | ICD-10-CM | POA: Diagnosis not present

## 2023-09-26 DIAGNOSIS — M62830 Muscle spasm of back: Secondary | ICD-10-CM | POA: Diagnosis not present

## 2023-09-27 ENCOUNTER — Encounter: Payer: Self-pay | Admitting: Family Medicine

## 2023-09-29 ENCOUNTER — Other Ambulatory Visit: Payer: Self-pay | Admitting: Family Medicine

## 2023-09-29 MED ORDER — ZEPBOUND 5 MG/0.5ML ~~LOC~~ SOAJ
5.0000 mg | SUBCUTANEOUS | 1 refills | Status: DC
Start: 2023-09-29 — End: 2023-11-08

## 2023-10-03 DIAGNOSIS — M9901 Segmental and somatic dysfunction of cervical region: Secondary | ICD-10-CM | POA: Diagnosis not present

## 2023-10-03 DIAGNOSIS — M9908 Segmental and somatic dysfunction of rib cage: Secondary | ICD-10-CM | POA: Diagnosis not present

## 2023-10-03 DIAGNOSIS — M62830 Muscle spasm of back: Secondary | ICD-10-CM | POA: Diagnosis not present

## 2023-10-03 DIAGNOSIS — M9902 Segmental and somatic dysfunction of thoracic region: Secondary | ICD-10-CM | POA: Diagnosis not present

## 2023-10-03 DIAGNOSIS — M531 Cervicobrachial syndrome: Secondary | ICD-10-CM | POA: Diagnosis not present

## 2023-10-10 DIAGNOSIS — M531 Cervicobrachial syndrome: Secondary | ICD-10-CM | POA: Diagnosis not present

## 2023-10-10 DIAGNOSIS — M62830 Muscle spasm of back: Secondary | ICD-10-CM | POA: Diagnosis not present

## 2023-10-10 DIAGNOSIS — M9902 Segmental and somatic dysfunction of thoracic region: Secondary | ICD-10-CM | POA: Diagnosis not present

## 2023-10-10 DIAGNOSIS — M9908 Segmental and somatic dysfunction of rib cage: Secondary | ICD-10-CM | POA: Diagnosis not present

## 2023-10-10 DIAGNOSIS — M9901 Segmental and somatic dysfunction of cervical region: Secondary | ICD-10-CM | POA: Diagnosis not present

## 2023-10-24 DIAGNOSIS — M9908 Segmental and somatic dysfunction of rib cage: Secondary | ICD-10-CM | POA: Diagnosis not present

## 2023-10-24 DIAGNOSIS — M62830 Muscle spasm of back: Secondary | ICD-10-CM | POA: Diagnosis not present

## 2023-10-24 DIAGNOSIS — M531 Cervicobrachial syndrome: Secondary | ICD-10-CM | POA: Diagnosis not present

## 2023-10-24 DIAGNOSIS — M9902 Segmental and somatic dysfunction of thoracic region: Secondary | ICD-10-CM | POA: Diagnosis not present

## 2023-10-24 DIAGNOSIS — M9901 Segmental and somatic dysfunction of cervical region: Secondary | ICD-10-CM | POA: Diagnosis not present

## 2023-11-07 ENCOUNTER — Encounter: Payer: Self-pay | Admitting: Family Medicine

## 2023-11-08 DIAGNOSIS — M62830 Muscle spasm of back: Secondary | ICD-10-CM | POA: Diagnosis not present

## 2023-11-08 DIAGNOSIS — M9908 Segmental and somatic dysfunction of rib cage: Secondary | ICD-10-CM | POA: Diagnosis not present

## 2023-11-08 DIAGNOSIS — M531 Cervicobrachial syndrome: Secondary | ICD-10-CM | POA: Diagnosis not present

## 2023-11-08 DIAGNOSIS — M9901 Segmental and somatic dysfunction of cervical region: Secondary | ICD-10-CM | POA: Diagnosis not present

## 2023-11-08 DIAGNOSIS — M9902 Segmental and somatic dysfunction of thoracic region: Secondary | ICD-10-CM | POA: Diagnosis not present

## 2023-11-08 MED ORDER — ZEPBOUND 5 MG/0.5ML ~~LOC~~ SOAJ: 5.0000 mg | SUBCUTANEOUS | 0 refills | Status: AC

## 2023-11-21 DIAGNOSIS — M9902 Segmental and somatic dysfunction of thoracic region: Secondary | ICD-10-CM | POA: Diagnosis not present

## 2023-11-21 DIAGNOSIS — M531 Cervicobrachial syndrome: Secondary | ICD-10-CM | POA: Diagnosis not present

## 2023-11-21 DIAGNOSIS — M62830 Muscle spasm of back: Secondary | ICD-10-CM | POA: Diagnosis not present

## 2023-11-21 DIAGNOSIS — M9901 Segmental and somatic dysfunction of cervical region: Secondary | ICD-10-CM | POA: Diagnosis not present

## 2023-11-21 DIAGNOSIS — M9908 Segmental and somatic dysfunction of rib cage: Secondary | ICD-10-CM | POA: Diagnosis not present

## 2023-12-28 DIAGNOSIS — Z124 Encounter for screening for malignant neoplasm of cervix: Secondary | ICD-10-CM | POA: Diagnosis not present

## 2023-12-28 DIAGNOSIS — Z6829 Body mass index (BMI) 29.0-29.9, adult: Secondary | ICD-10-CM | POA: Diagnosis not present

## 2023-12-28 DIAGNOSIS — Z01419 Encounter for gynecological examination (general) (routine) without abnormal findings: Secondary | ICD-10-CM | POA: Diagnosis not present
